# Patient Record
Sex: Female | Born: 1950 | Race: White | Hispanic: No | Marital: Married
Health system: Midwestern US, Academic
[De-identification: ages and names within clinical notes are randomized; demographics above are authoritative.]

---

## 2018-05-24 ENCOUNTER — Encounter: Admit: 2018-05-24 | Discharge: 2018-05-25 | Payer: MEDICARE

## 2018-06-08 ENCOUNTER — Encounter: Admit: 2018-06-08 | Discharge: 2018-06-09 | Payer: MEDICARE

## 2018-06-10 ENCOUNTER — Encounter: Admit: 2018-06-10 | Discharge: 2018-06-10 | Payer: MEDICARE

## 2018-06-12 ENCOUNTER — Encounter: Admit: 2018-06-12 | Discharge: 2018-06-13 | Payer: MEDICARE

## 2018-06-13 ENCOUNTER — Encounter: Admit: 2018-06-13 | Discharge: 2018-06-14 | Payer: MEDICARE

## 2018-06-14 ENCOUNTER — Encounter: Admit: 2018-06-14 | Discharge: 2018-06-14 | Payer: MEDICARE

## 2018-06-14 ENCOUNTER — Ambulatory Visit: Admit: 2018-06-14 | Discharge: 2018-06-15 | Payer: MEDICARE

## 2018-06-14 DIAGNOSIS — R197 Diarrhea, unspecified: ICD-10-CM

## 2018-06-14 DIAGNOSIS — Z820 Family history of epilepsy and other diseases of the nervous system: Principal | ICD-10-CM

## 2018-06-14 DIAGNOSIS — F419 Anxiety disorder, unspecified: ICD-10-CM

## 2018-06-14 DIAGNOSIS — IMO0001 Reflux: ICD-10-CM

## 2018-06-14 DIAGNOSIS — S0300XA Dislocation of jaw, unspecified side, initial encounter: ICD-10-CM

## 2018-06-14 DIAGNOSIS — E7800 Pure hypercholesterolemia, unspecified: ICD-10-CM

## 2018-06-14 NOTE — Progress Notes
???   predniSONE (DELTASONE) 10 mg tablet Take 10 mg by mouth three times daily.   ??? simvastatin (ZOCOR) 20 mg tablet Take 20 mg by mouth at bedtime daily.       There were no vitals filed for this visit.  There is no height or weight on file to calculate BMI.    Oswestry Total Score:: 50    Physical Exam  Ortho Exam  Alert and appropriate conversation  CN 2-12 appear grossly intact        Assessment and Plan:  68 y.o. 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     MRI L spine - Grade 2 L4/5 anterolisthesis with significant stenosis at this level.  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     We have reviewed the patients MRI with her today showing her the findings.  We would recommend she undergo and L4/5 decompression, interbody fusion and posterior fusion as well as a MIS discectomy at L5/S1.   We would like to obtain a Lumbar xray with flexion and extension prior to her surgery to make sure she doesn't have further instability at the L5/S1 level.  If she does she will need to have this level fused as well.   She is agreeable to surgical intervention and would like to get this done as soon as possible.  We told her we will get her in as soon as possible.               I personally supervised the resident performing the E/M, I examined the patient, discussed case with resident and patient, and concur with resident documentation of history, physical assessment and treatment plan unless otherwise noted.

## 2018-06-15 DIAGNOSIS — M48062 Spinal stenosis, lumbar region with neurogenic claudication: ICD-10-CM

## 2018-06-15 DIAGNOSIS — M5116 Intervertebral disc disorders with radiculopathy, lumbar region: Secondary | ICD-10-CM

## 2018-06-15 DIAGNOSIS — M4316 Spondylolisthesis, lumbar region: Principal | ICD-10-CM

## 2018-06-20 ENCOUNTER — Encounter: Admit: 2018-06-20 | Discharge: 2018-06-20 | Payer: MEDICARE

## 2018-06-20 ENCOUNTER — Encounter: Admit: 2018-06-20 | Discharge: 2018-06-21 | Payer: MEDICARE

## 2018-06-20 DIAGNOSIS — Z1159 Encounter for screening for other viral diseases: ICD-10-CM

## 2018-06-20 DIAGNOSIS — M5116 Intervertebral disc disorders with radiculopathy, lumbar region: ICD-10-CM

## 2018-06-20 DIAGNOSIS — M48062 Spinal stenosis, lumbar region with neurogenic claudication: ICD-10-CM

## 2018-06-20 DIAGNOSIS — M4316 Spondylolisthesis, lumbar region: Principal | ICD-10-CM

## 2018-06-21 ENCOUNTER — Encounter: Admit: 2018-06-21 | Discharge: 2018-06-21 | Payer: MEDICARE

## 2018-06-21 DIAGNOSIS — E7800 Pure hypercholesterolemia, unspecified: ICD-10-CM

## 2018-06-21 DIAGNOSIS — M48062 Spinal stenosis, lumbar region with neurogenic claudication: Secondary | ICD-10-CM

## 2018-06-21 DIAGNOSIS — M4316 Spondylolisthesis, lumbar region: Principal | ICD-10-CM

## 2018-06-21 DIAGNOSIS — Z820 Family history of epilepsy and other diseases of the nervous system: Principal | ICD-10-CM

## 2018-06-21 DIAGNOSIS — G4730 Sleep apnea, unspecified: ICD-10-CM

## 2018-06-21 DIAGNOSIS — IMO0001 Reflux: ICD-10-CM

## 2018-06-21 DIAGNOSIS — S0300XA Dislocation of jaw, unspecified side, initial encounter: ICD-10-CM

## 2018-06-21 DIAGNOSIS — R197 Diarrhea, unspecified: ICD-10-CM

## 2018-06-21 DIAGNOSIS — F419 Anxiety disorder, unspecified: ICD-10-CM

## 2018-06-21 DIAGNOSIS — M5126 Other intervertebral disc displacement, lumbar region: Secondary | ICD-10-CM

## 2018-06-21 LAB — CBC
Lab: 12 g/dL (ref 12.0–15.0)
Lab: 14 % (ref 11–15)
Lab: 188 10*3/uL — ABNORMAL LOW (ref >60)
Lab: 3.7 M/UL — ABNORMAL LOW (ref 4.0–5.0)
Lab: 33 g/dL — ABNORMAL HIGH (ref 32.0–36.0)
Lab: 37 % (ref 36–45)
Lab: 7.8 10*3/uL (ref 4.5–11.0)
Lab: 8.2 FL (ref >60)
Lab: 98 FL (ref 80–100)

## 2018-06-21 LAB — BASIC METABOLIC PANEL: Lab: 141 MMOL/L (ref 137–147)

## 2018-06-22 ENCOUNTER — Ambulatory Visit: Admit: 2018-06-21 | Discharge: 2018-06-22 | Payer: MEDICARE

## 2018-06-22 DIAGNOSIS — M48062 Spinal stenosis, lumbar region with neurogenic claudication: ICD-10-CM

## 2018-06-22 DIAGNOSIS — M5116 Intervertebral disc disorders with radiculopathy, lumbar region: ICD-10-CM

## 2018-06-22 DIAGNOSIS — M4316 Spondylolisthesis, lumbar region: ICD-10-CM

## 2018-06-22 DIAGNOSIS — Z01818 Encounter for other preprocedural examination: Principal | ICD-10-CM

## 2018-07-02 ENCOUNTER — Encounter: Admit: 2018-07-02 | Discharge: 2018-07-03 | Payer: MEDICARE

## 2018-07-02 ENCOUNTER — Encounter: Admit: 2018-07-02 | Discharge: 2018-07-02 | Payer: MEDICARE

## 2018-07-02 DIAGNOSIS — Z01818 Encounter for other preprocedural examination: ICD-10-CM

## 2018-07-02 NOTE — Progress Notes
Patient arrived to COVID clinic for COVID-19 testing 07/02/18 0946. Patient identity confirmed via photo I.D. Nasopharyngeal procedure explained to the patient.   Nasopharyngeal swab completed right  Patient education provided given and instructed patient self isolate until contacted w/ results and further instructions.   Swab collected by [facility], CMA.    Date symptoms began/reason for testing: Pre-Op    Yolanda Bonine, BSN, RN

## 2018-07-03 ENCOUNTER — Encounter: Admit: 2018-07-03 | Discharge: 2018-07-03

## 2018-07-03 DIAGNOSIS — F419 Anxiety disorder, unspecified: Secondary | ICD-10-CM

## 2018-07-03 DIAGNOSIS — R197 Diarrhea, unspecified: Secondary | ICD-10-CM

## 2018-07-03 DIAGNOSIS — Z820 Family history of epilepsy and other diseases of the nervous system: Secondary | ICD-10-CM

## 2018-07-03 DIAGNOSIS — Z1159 Encounter for screening for other viral diseases: Principal | ICD-10-CM

## 2018-07-03 DIAGNOSIS — E7800 Pure hypercholesterolemia, unspecified: Secondary | ICD-10-CM

## 2018-07-03 DIAGNOSIS — G4730 Sleep apnea, unspecified: Secondary | ICD-10-CM

## 2018-07-03 DIAGNOSIS — IMO0001 Reflux: Secondary | ICD-10-CM

## 2018-07-03 DIAGNOSIS — S0300XA Dislocation of jaw, unspecified side, initial encounter: Secondary | ICD-10-CM

## 2018-07-03 LAB — COVID-19 (SARS-COV-2) PCR

## 2018-07-03 NOTE — Progress Notes
Contacted patient via MyChart, as patient viewed result. Patient advised that COVID-19 test results are negative. Advised that patient can continue with the procedure from a COVID-19 testing perspective and should follow pre-procedure instructions. Advised that if they develop any concerning symptoms prior to the procedure to contact their procedure team, specialist, and/or PCP for assistance.      , DNP, RN, CCRN, SCRN, CNRN

## 2018-07-04 ENCOUNTER — Encounter: Admit: 2018-07-04 | Discharge: 2018-07-04

## 2018-07-04 ENCOUNTER — Encounter: Admit: 2018-07-04 | Discharge: 2018-07-06 | Payer: MEDICARE

## 2018-07-04 DIAGNOSIS — S0300XA Dislocation of jaw, unspecified side, initial encounter: Secondary | ICD-10-CM

## 2018-07-04 DIAGNOSIS — Z820 Family history of epilepsy and other diseases of the nervous system: Secondary | ICD-10-CM

## 2018-07-04 DIAGNOSIS — F419 Anxiety disorder, unspecified: Secondary | ICD-10-CM

## 2018-07-04 DIAGNOSIS — R197 Diarrhea, unspecified: Secondary | ICD-10-CM

## 2018-07-04 DIAGNOSIS — IMO0001 Reflux: Secondary | ICD-10-CM

## 2018-07-04 DIAGNOSIS — G4730 Sleep apnea, unspecified: Secondary | ICD-10-CM

## 2018-07-04 DIAGNOSIS — E7800 Pure hypercholesterolemia, unspecified: Secondary | ICD-10-CM

## 2018-07-04 MED ORDER — CLONAZEPAM 1 MG PO TAB
1 mg | Freq: Every evening | ORAL | 0 refills | Status: DC
Start: 2018-07-04 — End: 2018-07-06
  Administered 2018-07-05 – 2018-07-06 (×2): 1 mg via ORAL

## 2018-07-04 MED ORDER — MIDAZOLAM 1 MG/ML IJ SOLN
0 refills | Status: DC
Start: 2018-07-04 — End: 2018-07-05
  Administered 2018-07-04: 21:00:00 2 mg via INTRAVENOUS

## 2018-07-04 MED ORDER — CEFAZOLIN INJ 1GM IVP
1 g | INTRAVENOUS | 0 refills | Status: CP
Start: 2018-07-04 — End: ?
  Administered 2018-07-05 (×3): 1 g via INTRAVENOUS

## 2018-07-04 MED ORDER — METHOCARBAMOL 500 MG PO TAB
500 mg | ORAL | 0 refills | Status: DC | PRN
Start: 2018-07-04 — End: 2018-07-05

## 2018-07-04 MED ORDER — PANTOPRAZOLE 40 MG PO TBEC
40 mg | Freq: Every day | ORAL | 0 refills | Status: DC
Start: 2018-07-04 — End: 2018-07-06
  Administered 2018-07-05 – 2018-07-06 (×2): 40 mg via ORAL

## 2018-07-04 MED ORDER — DEXAMETHASONE SODIUM PHOSPHATE 4 MG/ML IJ SOLN
INTRAVENOUS | 0 refills | Status: DC
Start: 2018-07-04 — End: 2018-07-05
  Administered 2018-07-04: 21:00:00 4 mg via INTRAVENOUS

## 2018-07-04 MED ORDER — ONDANSETRON HCL (PF) 4 MG/2 ML IJ SOLN
INTRAVENOUS | 0 refills | Status: DC
Start: 2018-07-04 — End: 2018-07-05
  Administered 2018-07-05: 4 mg via INTRAVENOUS

## 2018-07-04 MED ORDER — SENNOSIDES-DOCUSATE SODIUM 8.6-50 MG PO TAB
1 | Freq: Two times a day (BID) | ORAL | 0 refills | Status: DC
Start: 2018-07-04 — End: 2018-07-06
  Administered 2018-07-05 – 2018-07-06 (×4): 1 via ORAL

## 2018-07-04 MED ORDER — ROCURONIUM 10 MG/ML IV SOLN
INTRAVENOUS | 0 refills | Status: DC
Start: 2018-07-04 — End: 2018-07-05
  Administered 2018-07-04: 21:00:00 70 mg via INTRAVENOUS
  Administered 2018-07-04: 23:00:00 10 mg via INTRAVENOUS

## 2018-07-04 MED ORDER — ACETAMINOPHEN 1,000 MG/100 ML (10 MG/ML) IV SOLN
0 refills | Status: DC
Start: 2018-07-04 — End: 2018-07-05
  Administered 2018-07-04: 1000 mg via INTRAVENOUS

## 2018-07-04 MED ORDER — LOSARTAN 50 MG PO TAB
50 mg | Freq: Every day | ORAL | 0 refills | Status: DC
Start: 2018-07-04 — End: 2018-07-06
  Administered 2018-07-05 – 2018-07-06 (×2): 50 mg via ORAL

## 2018-07-04 MED ORDER — BUPIVACAINE-EPINEPHRINE 0.25 %-1:200,000 IJ SOLN
0 refills | Status: DC
Start: 2018-07-04 — End: 2018-07-05
  Administered 2018-07-04: 22:00:00 6 mL via SUBCUTANEOUS

## 2018-07-04 MED ORDER — GABAPENTIN 300 MG PO CAP
300 mg | Freq: Three times a day (TID) | ORAL | 0 refills | Status: DC
Start: 2018-07-04 — End: 2018-07-06
  Administered 2018-07-05 – 2018-07-06 (×5): 300 mg via ORAL

## 2018-07-04 MED ORDER — THROMBIN (BOVINE) 5,000 UNIT TP SOLR
0 refills | Status: DC
Start: 2018-07-04 — End: 2018-07-05
  Administered 2018-07-04: 22:00:00 5000 [IU] via TOPICAL

## 2018-07-04 MED ORDER — HYDROMORPHONE (PF) 2 MG/ML IJ SYRG
.5 mg | INTRAVENOUS | 0 refills | Status: DC | PRN
Start: 2018-07-04 — End: 2018-07-05
  Administered 2018-07-05 (×4): 0.5 mg via INTRAVENOUS

## 2018-07-04 MED ORDER — CITALOPRAM 20 MG PO TAB
20 mg | Freq: Every day | ORAL | 0 refills | Status: DC
Start: 2018-07-04 — End: 2018-07-06
  Administered 2018-07-05 – 2018-07-06 (×2): 20 mg via ORAL

## 2018-07-04 MED ORDER — LIDOCAINE (PF) 10 MG/ML (1 %) IJ SOLN
.1-2 mL | INTRAMUSCULAR | 0 refills | Status: DC | PRN
Start: 2018-07-04 — End: 2018-07-06

## 2018-07-04 MED ORDER — CEFAZOLIN INJ 1GM IVP
1 g | INTRAVENOUS | 0 refills | Status: DC
Start: 2018-07-04 — End: 2018-07-05

## 2018-07-04 MED ORDER — BACITRACIN 50,000 UN NS 500 ML IRR BOT (OR)
0 refills | Status: DC
Start: 2018-07-04 — End: 2018-07-05
  Administered 2018-07-04 (×2): 500 mL

## 2018-07-04 MED ORDER — HYDROMORPHONE (PF) 2 MG/ML IJ SYRG
0 refills | Status: DC
Start: 2018-07-04 — End: 2018-07-05
  Administered 2018-07-04 (×2): .5 mg via INTRAVENOUS
  Administered 2018-07-04: 0.5 mg via INTRAVENOUS

## 2018-07-04 MED ORDER — ONDANSETRON HCL (PF) 4 MG/2 ML IJ SOLN
4 mg | INTRAVENOUS | 0 refills | Status: DC | PRN
Start: 2018-07-04 — End: 2018-07-06

## 2018-07-04 MED ORDER — SIMVASTATIN 20 MG PO TAB
20 mg | Freq: Every evening | ORAL | 0 refills | Status: DC
Start: 2018-07-04 — End: 2018-07-06
  Administered 2018-07-05 – 2018-07-06 (×2): 20 mg via ORAL

## 2018-07-04 MED ORDER — SUGAMMADEX 100 MG/ML IV SOLN
INTRAVENOUS | 0 refills | Status: DC
Start: 2018-07-04 — End: 2018-07-05
  Administered 2018-07-05: 150 mg via INTRAVENOUS

## 2018-07-04 MED ORDER — LIDOCAINE (PF) 200 MG/10 ML (2 %) IJ SYRG
0 refills | Status: DC
Start: 2018-07-04 — End: 2018-07-05
  Administered 2018-07-04: 21:00:00 100 mg via INTRAVENOUS

## 2018-07-04 MED ORDER — BISACODYL 10 MG RE SUPP
10 mg | Freq: Every day | RECTAL | 0 refills | Status: DC
Start: 2018-07-04 — End: 2018-07-06

## 2018-07-04 MED ORDER — DEXTRAN 70-HYPROMELLOSE (PF) 0.1-0.3 % OP DPET
0 refills | Status: DC
Start: 2018-07-04 — End: 2018-07-05
  Administered 2018-07-04: 21:00:00 2 [drp] via OPHTHALMIC

## 2018-07-04 MED ORDER — SODIUM CHLORIDE 0.9 % IV SOLP
1000 mL | INTRAVENOUS | 0 refills | Status: DC
Start: 2018-07-04 — End: 2018-07-05
  Administered 2018-07-04: 18:00:00 1000 mL via INTRAVENOUS
  Administered 2018-07-04: 23:00:00 1000.000 mL via INTRAVENOUS

## 2018-07-04 MED ORDER — ETOMIDATE 2 MG/ML IV SOLN
0 refills | Status: DC
Start: 2018-07-04 — End: 2018-07-05
  Administered 2018-07-04: 21:00:00 16 mg via INTRAVENOUS

## 2018-07-04 MED ORDER — FENTANYL CITRATE (PF) 50 MCG/ML IJ SOLN
25-50 ug | INTRAVENOUS | 0 refills | Status: DC | PRN
Start: 2018-07-04 — End: 2018-07-05

## 2018-07-04 MED ORDER — EPHEDRINE SULFATE 50 MG/ML IJ SOLN
0 refills | Status: DC
Start: 2018-07-04 — End: 2018-07-05
  Administered 2018-07-04: 22:00:00 10 mg via INTRAVENOUS

## 2018-07-04 MED ORDER — CEFAZOLIN 1 GRAM IJ SOLR
0 refills | Status: DC
Start: 2018-07-04 — End: 2018-07-05
  Administered 2018-07-04: 21:00:00 2 g via INTRAVENOUS

## 2018-07-04 MED ORDER — DOCUSATE SODIUM 100 MG PO CAP
100 mg | Freq: Two times a day (BID) | ORAL | 0 refills | Status: DC
Start: 2018-07-04 — End: 2018-07-06
  Administered 2018-07-05 – 2018-07-06 (×4): 100 mg via ORAL

## 2018-07-04 MED ORDER — OXYCODONE 5 MG PO TAB
5-10 mg | ORAL | 0 refills | Status: DC | PRN
Start: 2018-07-04 — End: 2018-07-06
  Administered 2018-07-05 (×3): 10 mg via ORAL
  Administered 2018-07-05: 04:00:00 5 mg via ORAL
  Administered 2018-07-06 (×4): 10 mg via ORAL

## 2018-07-04 MED ORDER — ACETAMINOPHEN 325 MG PO TAB
650 mg | ORAL | 0 refills | Status: DC | PRN
Start: 2018-07-04 — End: 2018-07-06
  Administered 2018-07-05 – 2018-07-06 (×7): 650 mg via ORAL

## 2018-07-04 MED ORDER — MAGNESIUM HYDROXIDE 2,400 MG/10 ML PO SUSP
10 mL | Freq: Every day | ORAL | 0 refills | Status: DC
Start: 2018-07-04 — End: 2018-07-06
  Administered 2018-07-05 – 2018-07-06 (×2): 10 mL via ORAL

## 2018-07-05 MED ORDER — HEPARIN, PORCINE (PF) 5,000 UNIT/0.5 ML IJ SYRG
5000 [IU] | SUBCUTANEOUS | 0 refills | Status: DC
Start: 2018-07-05 — End: 2018-07-06
  Administered 2018-07-06 (×2): 5000 [IU] via SUBCUTANEOUS

## 2018-07-05 MED ORDER — METHOCARBAMOL 750 MG PO TAB
750 mg | ORAL | 0 refills | Status: DC
Start: 2018-07-05 — End: 2018-07-05

## 2018-07-05 MED ORDER — DIAZEPAM 5 MG PO TAB
2.5 mg | ORAL | 0 refills | Status: DC | PRN
Start: 2018-07-05 — End: 2018-07-06
  Administered 2018-07-06: 06:00:00 2.5 mg via ORAL

## 2018-07-05 MED ORDER — PATCH DOCUMENTATION - LIDOCAINE 5%
Freq: Two times a day (BID) | TRANSDERMAL | 0 refills | Status: DC
Start: 2018-07-05 — End: 2018-07-06

## 2018-07-05 MED ORDER — METHOCARBAMOL 500 MG PO TAB
500 mg | ORAL | 0 refills | Status: DC
Start: 2018-07-05 — End: 2018-07-05
  Administered 2018-07-05: 14:00:00 500 mg via ORAL

## 2018-07-05 MED ORDER — METHOCARBAMOL 750 MG PO TAB
750 mg | ORAL | 0 refills | Status: DC
Start: 2018-07-05 — End: 2018-07-06
  Administered 2018-07-06 (×3): 750 mg via ORAL

## 2018-07-05 MED ORDER — METHOCARBAMOL 750 MG PO TAB
750 mg | ORAL | 0 refills | Status: DC
Start: 2018-07-05 — End: 2018-07-05
  Administered 2018-07-05: 19:00:00 750 mg via ORAL

## 2018-07-05 MED ORDER — LIDOCAINE 5 % TP PTMD
1 | Freq: Every day | TOPICAL | 0 refills | Status: DC
Start: 2018-07-05 — End: 2018-07-06
  Administered 2018-07-06 (×2): 1 via TOPICAL

## 2018-07-05 NOTE — Other
SPACER SPINAL 22X8X8-14MM RISE 10D - SNA  SPACER SPINAL 22X8X8-14MM RISE 10D NA [employer] NA  1 Implanted   ROD SPINAL 5.5MM CREO MIS CURVE TITANIUM - SNA  ROD SPINAL 5.5MM CREO MIS CURVE TITANIUM NA [employer] NA  1 Implanted         Drains: None    Disposition:  PACU - stable    Luis Abed, MD  Pager [phone]

## 2018-07-06 ENCOUNTER — Encounter: Admit: 2018-07-04 | Discharge: 2018-07-04

## 2018-07-06 ENCOUNTER — Encounter: Admit: 2018-07-06 | Discharge: 2018-07-06

## 2018-07-06 DIAGNOSIS — F17210 Nicotine dependence, cigarettes, uncomplicated: ICD-10-CM

## 2018-07-06 DIAGNOSIS — M5116 Intervertebral disc disorders with radiculopathy, lumbar region: ICD-10-CM

## 2018-07-06 DIAGNOSIS — G4730 Sleep apnea, unspecified: Secondary | ICD-10-CM

## 2018-07-06 DIAGNOSIS — M4316 Spondylolisthesis, lumbar region: Principal | ICD-10-CM

## 2018-07-06 DIAGNOSIS — Z7982 Long term (current) use of aspirin: Secondary | ICD-10-CM

## 2018-07-06 DIAGNOSIS — F419 Anxiety disorder, unspecified: Secondary | ICD-10-CM

## 2018-07-06 DIAGNOSIS — Z820 Family history of epilepsy and other diseases of the nervous system: Secondary | ICD-10-CM

## 2018-07-06 DIAGNOSIS — M48062 Spinal stenosis, lumbar region with neurogenic claudication: Secondary | ICD-10-CM

## 2018-07-06 DIAGNOSIS — Z01818 Encounter for other preprocedural examination: ICD-10-CM

## 2018-07-06 DIAGNOSIS — M5126 Other intervertebral disc displacement, lumbar region: Secondary | ICD-10-CM

## 2018-07-06 DIAGNOSIS — S0300XA Dislocation of jaw, unspecified side, initial encounter: Secondary | ICD-10-CM

## 2018-07-06 DIAGNOSIS — E7800 Pure hypercholesterolemia, unspecified: Secondary | ICD-10-CM

## 2018-07-06 DIAGNOSIS — IMO0001 Reflux: Secondary | ICD-10-CM

## 2018-07-06 DIAGNOSIS — R197 Diarrhea, unspecified: Secondary | ICD-10-CM

## 2018-07-06 MED ORDER — OXYCODONE 5 MG PO TAB
5-10 mg | ORAL_TABLET | ORAL | 0 refills | 6.00000 days | Status: DC | PRN
Start: 2018-07-06 — End: 2018-07-10

## 2018-07-06 MED ORDER — ASPIRIN 81 MG PO TBEC
81 mg | ORAL_TABLET | Freq: Every evening | ORAL | 0 refills | Status: AC
Start: 2018-07-06 — End: ?

## 2018-07-06 MED ORDER — ACETAMINOPHEN 325 MG PO TAB
650 mg | ORAL | 0 refills | Status: AC | PRN
Start: 2018-07-06 — End: ?

## 2018-07-06 MED ORDER — SENNOSIDES-DOCUSATE SODIUM 8.6-50 MG PO TAB
1 | Freq: Two times a day (BID) | ORAL | 0 refills | Status: AC | PRN
Start: 2018-07-06 — End: ?

## 2018-07-06 MED ORDER — METHOCARBAMOL 750 MG PO TAB
750 mg | ORAL_TABLET | ORAL | 0 refills | Status: AC | PRN
Start: 2018-07-06 — End: ?

## 2018-07-06 NOTE — Care Plan
Discussed AVS including: wound care, activity restrictions, medication instructions, goals of care at home, follow up appointment and clinic contact.

## 2018-07-06 NOTE — Care Coordination-Inpatient
Melanie Tucker, Melanie Tucker    Minimally Invasive Lumbar 5-Sacral 1 Microdiscectomy on 07/04/2018 with Dr. Earlene Plater    Neurosurgery Discharge Instructions    Contact information:  ? Call Neurosurgery if you have questions or are experiencing problems at discharge [phone].  Post-operative wound care:  ? Your incisions have glue in place. Your incision may be open to air.  ? Keep your incision dry for 3 days. Cover incision while showering until 07/07/2018.  Starting 07/08/2018 use baby shampoo to wash incision daily, pat dry and leave open to air.  ? Do not submerge (pool/tub) your incision under water at all for 4 weeks.  ? Have someone look at your incision every day. It should look the same or better daily.  ? Do not apply any ointment, cream or lotions to incision line.   Activity restrictions:  ? Avoid pushing, pulling, lifting or bending more than 10 pounds (about a gallon of milk). If you hold children, they should be placed in your lap or crawl into lap if old enough.  ? Do NOT drive until you are cleared by your physician.   Post-operative pain and medications:  ? Please use your pain medications and muscle relaxers as prescribed.  ? Pain medications can make you constipated. You may take a stool softener and miralax.  ? Tylenol and Ibuprofen are approved for pain control.  This is available over the counter.   Follow up appointment:   ? 07/18/2018 @ 1:00 with Nurse Practitioner for wound check and follow up.  ? 08/16/2018 @ 1:30 with Dr. Earlene Plater Neurosurgeon for surgical follow up.  Please contact Neurosurgery if you develop any of the following:  ? New or worsening numbness, tingling, or decrease sensation in arms or legs.  ? New or worsening changes in mobility or gait (walking).  ? Fever 101 or greater.  Redness, swelling, continuous oozing, fluid collection, warmth or bad odor near the incision site.  ? Intense pain that is getting worse or unrelieved by pain medications or muscle relaxers.          Kem Kays, RN Clinical Nurse Coordinator  Neurosurgery  Cell: [phone]  Office: [phone]

## 2018-07-06 NOTE — Progress Notes
Gait: Assistance Level: Minimal assist  Gait: Assistive Device: Roller walker    Activity Tolerance  Endurance: 3/5 Tolerates 25-30 Minutes Exercise w/Multiple Rests    Cognition  Overall Cognitive Status: WFL to Adequately Complete Self Care Tasks Safely  Attention: Awake/Alert  Cognition Comment: Slight intermittent confusion today, requiring reminders and repetition of instruction.     Assessment  Assessment: Decreased ADL Status;Decreased Endurance;Decreased High-Level ADLs;Decreased Self-Care Trans  Prognosis: Good;w/Cont OT s/p Acute Discharge  Goal Formulation: Patient    Plan  Progress: Progressing Toward Goals  OT Frequency: 5x/week  OT Plan for Next Visit: progress bed mobility and functional transfers with LRD    ADL Goals  Patient Will Perform LE Dressing: w/ Modified Independent  Patient Will Perform Toileting: Independently  ???  Functional Transfer Goals  Pt Will Perform All Functional Transfers: Modified Independent(w/ least restrictive assistive device )    OT Discharge Recommendations  Recommendation: Home with intermittent supervision/assistance  Recommendation for Therapy Post Discharge: Home health  Patient Currently Requires Physical Assist With: All home functioning ADLs;All mobility  Patient Currently Requires Equipment: Walker with wheels  Patient requires the use of a walker with wheels to complete ADL???s in the home including meal preparation, ambulation the bathroom for toileting, bathing and grooming, and safe home mobility.  Patient is unable to complete these ADL???s with a cane or crutch and can safely use the walker.     Adaptive equipment provided by OT 6/4.     Therapist: Darlin Priestly, OTR/L [phone]  Date: 07/06/2018

## 2018-07-10 ENCOUNTER — Encounter: Admit: 2018-07-10 | Discharge: 2018-07-10

## 2018-07-10 MED ORDER — OXYCODONE 5 MG PO TAB
10 mg | ORAL_TABLET | ORAL | 0 refills | 6.00000 days | Status: DC | PRN
Start: 2018-07-10 — End: 2018-08-16

## 2018-07-10 NOTE — Telephone Encounter
[facility] calling stating that the Oxy prescription patient was given, they only dispensed #42 out of #60 was written for. Patient's spouse is calling pharmacy today stating she is almost out and they need a refill. This was dispensed to her four days ago. Patient's spouse says pain has been so bad that she has been taking 2 every four hours. Will route to Dr Rosana Hoes and covering RN to see what would like to do.

## 2018-07-12 ENCOUNTER — Encounter: Admit: 2018-07-12 | Discharge: 2018-07-12

## 2018-07-12 NOTE — Telephone Encounter
Spoke with Leda Gauze tonight regarding recovery from procedure.  She states she is doing much better with pain in last two days.  She states her doctor called her in 10 mg Tablets of Oxycodone every 6 hours and this helped tremendously, she is also using Tylenol 1000 mg Q 8.   She endorses good mobility, sleeping better and feels she is on the upward swing.  She states incision is looking good and her friend is an Therapist, sports and states incision is healing well.  She denies any questions/concerns tonight, she is aware of all upcoming appointments and has clinic number for any needs.       Cheryl Flash, RN  Clinical Nurse Coordinator  Neurosurgery  Cell: [phone]  Office: [phone]

## 2018-07-18 ENCOUNTER — Encounter: Admit: 2018-07-18 | Discharge: 2018-07-18

## 2018-07-18 ENCOUNTER — Ambulatory Visit: Admit: 2018-07-18 | Discharge: 2018-07-19

## 2018-07-18 DIAGNOSIS — R197 Diarrhea, unspecified: Secondary | ICD-10-CM

## 2018-07-18 DIAGNOSIS — Z5189 Encounter for other specified aftercare: Secondary | ICD-10-CM

## 2018-07-18 DIAGNOSIS — E7800 Pure hypercholesterolemia, unspecified: Secondary | ICD-10-CM

## 2018-07-18 DIAGNOSIS — G4730 Sleep apnea, unspecified: Secondary | ICD-10-CM

## 2018-07-18 DIAGNOSIS — S0300XA Dislocation of jaw, unspecified side, initial encounter: Secondary | ICD-10-CM

## 2018-07-18 DIAGNOSIS — F419 Anxiety disorder, unspecified: Secondary | ICD-10-CM

## 2018-07-18 DIAGNOSIS — IMO0001 Reflux: Secondary | ICD-10-CM

## 2018-07-18 DIAGNOSIS — Z820 Family history of epilepsy and other diseases of the nervous system: Secondary | ICD-10-CM

## 2018-07-18 NOTE — Progress Notes
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 Hegler returns today for 2 wk f/u s/p MINIMALLY INVASIVE LUMBAR 5-SACRAL 1 MICRODISCECTOMY  POSTERIOR LUMBAR DECOMPRESSION AND INTERBODY FUSION, LUMBAR 4-5 on 07/04/2018 by Dr. Rosana Hoes.  Report back and leg pain much improved.  Having moderate incisional pain.  Eating/drinking well.  Denies constipation.  Incision edges well approximated.   No S/S of infection noted.     Physical Exam     Alert/Fully Oriented  Neuro Intact and Stable  Incision edges well approximated.  No S/S of infection noted.   Gait Steady     Ms. Leclaire returns today for 2 wk f/u.  Overall, she is doing well.  Incision is healing well.  No S/S of infection noted.   Discussed wound care.  All of her questions were answered.  She will return for surgical follow up on 08/02/2018 with Dr. Rosana Hoes.         Please call [phone] with questions or concern    Holland Commons, APRN-NP             Surgeon: Dr Rosana Hoes  Procedure: MINIMALLY INVASIVE LUMBAR 5-SACRAL 1 MICRODISCECTOMY POSTERIOR LUMBAR DECOMPRESSION AND INTERBODY FUSION on 07/04/18  Dressing: incision is C,D,I with no s/s of infection. Skin was closed with monocryl and dermabond  Imaging: n/a  Home health/Rehab: n/a  Support: spouse  POD: 13

## 2018-08-01 NOTE — Patient Instructions
N/A

## 2018-08-09 ENCOUNTER — Encounter: Admit: 2018-08-09 | Discharge: 2018-08-09

## 2018-08-16 ENCOUNTER — Ambulatory Visit: Admit: 2018-08-16 | Discharge: 2018-08-16

## 2018-08-16 ENCOUNTER — Encounter: Admit: 2018-08-16 | Discharge: 2018-08-16

## 2018-08-16 DIAGNOSIS — M48062 Spinal stenosis, lumbar region with neurogenic claudication: Secondary | ICD-10-CM

## 2018-08-16 DIAGNOSIS — M5116 Intervertebral disc disorders with radiculopathy, lumbar region: Secondary | ICD-10-CM

## 2018-08-16 DIAGNOSIS — E7800 Pure hypercholesterolemia, unspecified: Secondary | ICD-10-CM

## 2018-08-16 DIAGNOSIS — IMO0001 Reflux: Secondary | ICD-10-CM

## 2018-08-16 DIAGNOSIS — F419 Anxiety disorder, unspecified: Secondary | ICD-10-CM

## 2018-08-16 DIAGNOSIS — M4316 Spondylolisthesis, lumbar region: Principal | ICD-10-CM

## 2018-08-16 DIAGNOSIS — S0300XA Dislocation of jaw, unspecified side, initial encounter: Secondary | ICD-10-CM

## 2018-08-16 DIAGNOSIS — R197 Diarrhea, unspecified: Secondary | ICD-10-CM

## 2018-08-16 DIAGNOSIS — G4730 Sleep apnea, unspecified: Secondary | ICD-10-CM

## 2018-08-16 DIAGNOSIS — Z820 Family history of epilepsy and other diseases of the nervous system: Secondary | ICD-10-CM

## 2018-08-16 NOTE — Progress Notes
Date of Service: 08/16/2018     Subjective:          She presents for a 6-week follow-up after a discectomy and fusion of the lumbar spine.    History of Present Illness    Melanie Tucker is a 68 y.o. female who presents 6 weeks postop from a L4-5 decompression fusion and L5-S1 discectomy.  She has been doing very well at home.  She can walk about three fourths of a mile until she gets the right foot numbness that prevents her from walking any further.  She is otherwise been quite active.  She has been maintaining some degree of restrictions including lifting as well as bending.  Wishes to see if those restrictions can be lifted.    He is overall very happy with the results of the surgery.  However she continues to have some right foot numbness that has been bothersome for her since surgery.     Review of Systems   Constitutional: Negative.    HENT: Negative.    Eyes: Negative.    Respiratory: Negative.    Cardiovascular: Negative.    Gastrointestinal: Negative.    Endocrine: Negative.    Genitourinary: Negative.    Musculoskeletal: Negative.    Skin: Negative.    Allergic/Immunologic: Negative.    Neurological: Positive for numbness.   Hematological: Negative.    Psychiatric/Behavioral: Negative.          Objective:         ??? acetaminophen (TYLENOL) 325 mg tablet Take two tablets by mouth every 4 hours as needed. Indications: pain   ??? aspirin EC 81 mg tablet Take one tablet by mouth at bedtime daily. Take with food. HOLD for 1 week post surgery, may RESUME on 07/11/2018   ??? calcium carbonate/vitamin D-3 (OSCAL-500+D) 1250 mg/200 unit tablet Take 1 tablet by mouth daily. Calcium Carb 1250mg  delivers 500mg  elemental Ca   ??? citalopram (CELEXA) 20 mg tablet Take 20 mg by mouth daily.     ??? clonazePAM (KLONOPIN) 1 mg tablet Take 1 mg by mouth at bedtime daily.   ??? fish oil- omega 3-DHA/EPA 300/1,000 mg capsule Take 1 capsule by mouth at bedtime daily. ??? gabapentin (NEURONTIN) 300 mg capsule Take 300 mg by mouth three times daily.   ??? losartan (COZAAR) 50 mg tablet Take 50 mg by mouth daily.     ??? methocarbamoL (ROBAXIN) 750 mg tablet Take one tablet by mouth every 6 hours as needed for Spasms. Indications: muscle spasm   ??? pantoprazole DR (PROTONIX) 40 mg tablet Take 40 mg by mouth daily.   ??? senna/docusate (SENOKOT-S) 8.6/50 mg tablet Take one tablet by mouth twice daily as needed.   ??? simvastatin (ZOCOR) 20 mg tablet Take 20 mg by mouth at bedtime daily.     ??? Vit  A,C,E-Zinc-Copper (ICAPS AREDS) 14,320-226-200 unit-mg-unit cap Take 1 capsule by mouth daily.   ??? vitamins, multiple tablet Take 1 tablet by mouth daily.     Vitals:    08/16/18 1331   Weight: 72.1 kg (159 lb)   Height: 160 cm (63)   PainSc: Zero     Body mass index is 28.17 kg/m???.       Physical Exam  Constitutional:       Appearance: Normal appearance.   HENT:      Head: Normocephalic and atraumatic.   Eyes:      Extraocular Movements: Extraocular movements intact.   Neck:      Musculoskeletal: Normal  range of motion.   Pulmonary:      Effort: Pulmonary effort is normal.   Abdominal:      Palpations: Abdomen is soft.   Skin:     General: Skin is warm.   Neurological:      General: No focal deficit present.      Mental Status: She is alert.   Psychiatric:         Mood and Affect: Mood normal.       Exam she is awake and alert with fluent speech.  Her cranial nerves are grossly intact.  She has 5 out of 5 strength in the upper and lower extremities.  She has a normal gait.  Her sensations are grossly intact except on the right foot where there is some decrease sensation primarily in the anterior half of her foot.  No cerebellar dysfunction.    Radiology: Flexion-extension imaging of her lumbar spine.  There is no abnormal motion.  Hardware appears intact.    Assessment and Plan:  [name] is a 68 year old who is 6 weeks postop from an L4-5 interbody fusion and an L5-S1 discectomy.  She has recovered well which is reassuring.  She continues to have some right foot numbness that should get better with time.    She does not have any major restrictions postsurgically.  We will have her follow-up in about 2 months with another set of plain film images of her lumbar spine.    I personally supervised the resident performing the E/M, I examined the patient, discussed case with resident and patient, and concur with resident documentation of history, physical assessment and treatment plan unless otherwise noted.

## 2018-08-16 NOTE — Patient Instructions
Thank you for participating in your Clinic/Tele Health visit today with Dr. Justin Davis.      Clinical Nurse Coordinator for Dr Davis-Department of Neurosurgery    RN, BSN [phone].  Please call or send a my chart message with any nursing questions or concerns.  Fax [phone]  Appointment scheduling [phone]

## 2018-10-12 ENCOUNTER — Encounter: Admit: 2018-10-12 | Discharge: 2018-10-12

## 2018-10-12 DIAGNOSIS — M5116 Intervertebral disc disorders with radiculopathy, lumbar region: Secondary | ICD-10-CM

## 2018-10-12 DIAGNOSIS — M4316 Spondylolisthesis, lumbar region: Secondary | ICD-10-CM

## 2018-10-18 ENCOUNTER — Ambulatory Visit: Admit: 2018-10-18 | Discharge: 2018-10-18 | Payer: MEDICARE

## 2018-10-18 ENCOUNTER — Encounter: Admit: 2018-10-18 | Discharge: 2018-10-18 | Payer: MEDICARE

## 2018-10-18 NOTE — Progress Notes
Date of Service: 10/18/2018     Subjective:               History of Present Illness    Melanie Tucker is a 68 y.o. female.  She is here for follow-up of her L4-5 minimally invasive fusion and L5/S1 microdiscectomy on 07/04/2018.  She is doing quite well and denies any significant pain.  She is back to her normal level of activity.  I independently reviewed lumbar films from clinic today which demonstrate good, stable placement of the hardware without abnormal motion on flexion/extension views.       Review of Systems   Constitutional: Negative.    HENT: Negative.    Eyes: Negative.    Respiratory: Negative.    Cardiovascular: Negative.    Gastrointestinal: Negative.    Endocrine: Negative.    Genitourinary: Negative.    Musculoskeletal: Negative.    Skin: Negative.    Allergic/Immunologic: Negative.    Neurological: Negative.    Hematological: Negative.    Psychiatric/Behavioral: Negative.          Objective:         ? acetaminophen (TYLENOL) 325 mg tablet Take two tablets by mouth every 4 hours as needed. Indications: pain   ? aspirin EC 81 mg tablet Take one tablet by mouth at bedtime daily. Take with food. HOLD for 1 week post surgery, may RESUME on 07/11/2018   ? calcium carbonate/vitamin D-3 (OSCAL-500+D) 1250 mg/200 unit tablet Take 1 tablet by mouth daily. Calcium Carb 1250mg  delivers 500mg  elemental Ca   ? citalopram (CELEXA) 20 mg tablet Take 20 mg by mouth daily.     ? clonazePAM (KLONOPIN) 1 mg tablet Take 1 mg by mouth at bedtime daily.   ? fish oil- omega 3-DHA/EPA 300/1,000 mg capsule Take 1 capsule by mouth at bedtime daily.   ? gabapentin (NEURONTIN) 300 mg capsule Take 300 mg by mouth three times daily.   ? losartan (COZAAR) 50 mg tablet Take 50 mg by mouth daily.     ? methocarbamoL (ROBAXIN) 750 mg tablet Take one tablet by mouth every 6 hours as needed for Spasms. Indications: muscle spasm   ? pantoprazole DR (PROTONIX) 40 mg tablet Take 40 mg by mouth daily. ? senna/docusate (SENOKOT-S) 8.6/50 mg tablet Take one tablet by mouth twice daily as needed.   ? simvastatin (ZOCOR) 20 mg tablet Take 20 mg by mouth at bedtime daily.     ? Vit  A,C,E-Zinc-Copper (ICAPS AREDS) 14,320-226-200 unit-mg-unit cap Take 1 capsule by mouth daily.   ? vitamins, multiple tablet Take 1 tablet by mouth daily.     Vitals:    10/18/18 1346   BP: (!) 142/77   Pulse: 73   Weight: 72.1 kg (159 lb)   Height: 160 cm (63)   PainSc: Zero     Body mass index is 28.17 kg/m?Marland Kitchen       Physical Exam  Awake, alert, and oriented x4  Grossly neurologically intact       Assessment and Plan:  Patient is healed well from her surgical intervention.  I will see her back on an as-needed basis.  Oswestry Total Score:: 4

## 2019-04-21 ENCOUNTER — Encounter: Admit: 2019-04-21 | Discharge: 2019-04-21 | Payer: MEDICARE

## 2019-04-21 NOTE — Patient Instructions
Coronavirus Disease 2019 (COVID-19): Overview  Coronavirus disease 2019 (COVID-19) is a respiratory illness. It's caused by a new (novel) coronavirus. There are many types of coronavirus. Coronaviruses are a very common cause of colds and bronchitis. They may sometimes cause lung infection (pneumonia). Symptoms can range from mild to severe. Some people have no symptoms.?These viruses are also found in some animals.   All 50 states in the [place] have reported cases of COVID-19. Most states report community spread of COVID-19. This means the source of the illness is not known.?COVID-19 is a rapidly-emerging infectious disease. This means that scientists are actively researching it.?There are information updates regularly.   Public health officials are working to find the source. How the virus spreads is not yet fully understood, but it seems to spread and infect people fairly easily. Some people who have been infected in an area may not be sure how or where they were infected. The virus may be spread through droplets of fluid that a person coughs or sneezes into the air. It may be spread if you touch a surface with the virus on it, such as a handle or object, and then touch your eyes, nose, or mouth.   For the latest information, visit the CDC website at [employer]. Or call 800-CDC-INFO 570-517-8307).     What are the symptoms of COVID-19?  Some people have no symptoms or mild symptoms. Symptoms can also vary from person to person. As experts learn more about COVID-19, other symptoms are being reported. Symptoms may appear 2 to 14 days after contact with the virus:   ? Fever or chills  ? Coughing  ? Trouble breathing or feeling short of breath  ? Sore throat  ? Stuffy or runny nose  ? Headache and body aches  ? Fatigue  ? Nausea, vomiting, diarrhea, or abdominal pain  ? New loss of sense of smell or taste  You can check your symptoms with the CDC?s Coronavirus Self-Checker.     What are possible complications from COVID-19?  In many cases, this virus can cause infection (pneumonia) in both lungs. In some cases, this can cause death. Certain people are at higher risk for complications. This includes older adults and people with serious chronic health conditions such as heart or lung disease, diabetes, or kidney disease. It includes people with health conditions that suppress the immune system. And it includes people taking medicines that suppress the immune system.   As experts learn more about COVID-19, other complications are being reported that may be linked to COVID-19. Rarely, some children have developed severe complications called multisystem inflammatory syndrome in children (MIS-C). MIS-C seems to be similar to [facility] disease, a rare condition causing inflammation of blood vessels and body organs. It's not yet known if MIS-C happens only in children, or if adults are also at risk. It's also not known if it's related to COVID-19, because many children, but not all, have tested positive for the virus. Experts continue to study MIS-C. The CDC advises healthcare providers to report to local health departments any person under age 57 years old who is ill enough to be in the hospital and has all of the following:   ? A fever over 100.4?F (38.0?C) for more than 24 hours and a positive SARS-CoV-2 test or exposure to the virus in the last 4 weeks   ? Inflammation in at least 2 organs such as the heart, lungs, or kidneys with lab tests that show inflammation   ? No  other diagnoses besides COVID-19 explain the child's symptoms     How is COVID-19 diagnosed?  Your healthcare provider will ask about your symptoms. He or she will ask where you live, and about your recent travel, and any contact with sick people. If your healthcare provider thinks you may have COVID-19, he or she will consider whether to test you for COVID-19. This depends on the availability of testing in your area, and how sick you are. Follow all instructions from your healthcare provider. Guidelines for testing may change as more information about the virus becomes available. Currently, COVID-19 is diagnosed by:   ? Viral test.  Viral tests tell if you have a current COVID-19 infection. A nose-throat swab may be wiped inside your nose to the back of your throat. Or a sample of your saliva may be taken. Either of these samples will be checked for the SARS-CoV-2 virus. Availability of tests vary. Some test kits can be done at home. But both nose-throat swabs and saliva tests must be sent to a lab to be looked at.   If your healthcare provider thinks or confirms that you have COVID-19, you may have other tests. These tests may include:   ? Antibody blood test.  Antibody tests are being looked at to find out if a person has previously been infected with the virus and may now have antibodies such as SARS AB IgG in their blood to give some immunity. The accuracy and availability of antibody tests vary. An antibody test may not be able to show if you have a current infection because it can take up to a few weeks after infection to make antibodies. It's not yet known how long immunity lasts after being infected with the virus.   ? Sputum culture.  A small sample of mucus coughed from your lungs (sputum) may be collected if you have a moist cough. It may be checked for the virus or to look for pneumonia.   ? Imaging tests.  You may have a chest X-ray or CT scan.       Note about reinfection and your immunity  At this time, it's unclear if people can be reinfected with COVID-19. The CDC notes that if a person has fully recovered from COVID-19 and is retested within 3 months of the first infection, they may continue to have low levels of the virus in their body and test positive for COVID-19, even though they are not spreading COVID-19. Having a positive COVID-19 test after an infection doesn't mean you can't be reinfected. It's not yet known how long immunity lasts after being infected with the virus.   How is COVID-19 treated?  There is currently no medicine proven to prevent or treat the virus. Some experimental medicines are being tested for COVID-19. Other medicines used to treat other conditions are being looked at for COVID-19, but these are not currently approved to treat it.   The most proven treatments right now are those to help your body while it fights the virus. This is known as supportive care. Supportive care may include:   ? Getting rest.  This helps your body fight the illness.   ? Staying hydrated.  Drinking liquids is the best way to prevent dehydration.. Try to drink 6 to 8 glasses of liquids every day, or as advised by your provider. Also check with your provider about which fluids are best for you. Don't drink fluids that contain caffeine or alcohol.   ? Taking over-the-counter (  OTC)?pain medicine.  These are used to help ease pain and reduce fever. Follow your healthcare provider's instructions for which OTC medicine to use.   For severe illness, you may need to stay in the hospital. Care during severe illness may include:   ? IV (intravenous) fluids.  These are given through a vein to help keep your body hydrated.   ? Oxygen. You may be given supplemental oxygen or ventilation with a breathing machine (ventilator). This is done so you get enough oxygen in your body.   ? Prone positioning.  Depending on how sick you are during your hospital stay, your healthcare team may turn you regularly on your stomach. This is called prone positioning. It helps increase the amount of oxygen you get to your lungs. Follow your healthcare team's instructions on position changes while you're in the hospital. Also follow their discharge advice on the best positions to help your breathing once you go home.   People who have had COVID-19 and are fully recovered may be asked by their healthcare team to consider donating plasma. This is called COVID-19 convalescent plasma donation. Plasma from people fully recovered from COVID-19 may contain antibodies to help fight COVID-19 in people who are currently seriously ill with the disease. Experts don't know if the donated plasma will work well as a treatment. Research continues, and the FDA has approved it for emergency use in certain people with serious or life-threatening COVID-19. Talk with your provider to learn more about convalescent plasma donation and whether you qualify to donate.       Are you at risk for COVID-19?  You are at risk for COVID-19 if you have had close contact with someone with the virus, or if you live in or traveled to an area with cases of it. Close contact means being within about 6 feet of someone, or living in the same house or visiting a person who has or may have COVID-19. Some recent studies suggest that COVID-19 may be spread by people who are not showing symptoms.     Preventing the Spread of Infection    ?     Hand Hygiene    The best way to prevent the spread of infections is to wash your hands or use hand sanitizer. Staff will clean hands between tasks and upon entering and exiting your hospital room. ?   For patients and visitors:    Clean hands frequently, upon entering and exiting a room, and after coughing and sneezing.    When washing hands with soap and water:   ? Wet hands with warm water  ? Apply soap   ? Lather soap by rubbing hands together for 20 seconds, covering all surfaces of hands and fingers  ? Rinse hands thoroughly  ? Dry hands with paper towel  ? Use a towel to turn off faucet  ?  When cleaning hands with hand sanitizer:   ? Apply hand sanitizer to hands  ? Rub on hands covering of hands and fingers until dry (about 15 to 20 seconds) ?      Coronavirus Disease 2019 (COVID-19): Caring for Yourself or Others   If you or a household member have symptoms of COVID-19, follow the guidelines below for preventing spread of the virus, and managing symptoms.     If you think you have COVID-19 symptoms  ? Stay home. Call your healthcare provider and tell them you have symptoms of COVID-19. Do this before going to any hospital or  clinic. Follow your provider's instructions. You may be advised to isolate yourself at home. This is called self-isolation.   ? Don?t panic. Keep in mind that other illnesses can cause similar symptoms.   ? Stay away from work, school, and public places. Limit physical contact with family members. Limit visitors. Don't kiss anyone or share eating or drinking utensils. Clean surfaces you touch with disinfectant. This is to help prevent the virus from spreading.   ? If you need to cough or sneeze, do it into a tissue. Then throw the tissue into the trash. If you don't have tissues, cough or sneeze into the bend of your elbow.   ? Don?t share food or personal items with people in your household. This includes items like eating and drinking utensils, towels, and bedding.   ? Wear a cloth face mask around other people. During a public health emergency, medical face masks may be reserved for healthcare workers. You may need to make a cloth face mask of your own. You can do this using a bandana, T-shirt, or other cloth. The CDC has instructions on how to make a face mask. Wear the mask so that it covers both your nose and mouth.   ? If you need to go to a hospital or clinic, expect that the healthcare staff will wear protective equipment such as masks, gowns, gloves, and eye protection. You may be put in a separate room. This is to prevent the possible virus from spreading.   ? Tell the healthcare staff about recent travel. This includes local travel on public transport. Staff may need to find other people you have been in contact with.   ? Follow all instructions the healthcare staff give you.    If you have been diagnosed with COVID-19  ? Stay home and start self-isolation. Don?t leave your home unless you need to get medical care. Don't go to work, school, or public areas. Don't use public transportation or taxis.   ? Follow all instructions from your healthcare provider. Call your healthcare provider?s office before going. They can prepare and give you instructions. This will help prevent the virus from spreading.   ? If you need to go to a hospital or clinic, expect that the healthcare staff will wear protective equipment such as masks, gowns, gloves, and eye protection. You may be put in a separate room. This is to prevent the possible virus from spreading.   ? Wear a face mask. This is to protect other people from your germs. If you are not able to wear a mask, your caregivers should. During a public health emergency, medical face masks may be reserved for healthcare workers. You may need to make a cloth face mask of your own. You can do this using a bandana, T-shirt, or other cloth. The CDC has instructions on how to make a face mask. Wear the mask so that it covers both your nose and mouth.   ? Stay away from other people in your home.  ? Have no contact with pets and animals.  ? Don?t share food or personal items with people in your household. This includes items like eating and drinking utensils, towels, and bedding.   ? If you need to cough or sneeze, do it into a tissue. Then throw the tissue into the trash. If you don't have tissues, cough or sneeze into the bend of your elbow.   ? Wash your hands often.    Self-care at home?  There  is currently no medicine approved to prevent or treat the virus. Some experimental and other medicines are being tested against COVID-19. Other medicines used to treat other conditions are being looked at for COVID-19, but they are not currently approved to treat it.   Current treatment is mainly aimed at helping your body while it fights the virus. This is known as supportive care. Take care of yourself at home by:   ? Getting rest. This helps your body fight the illness.   ? Staying hydrated.  Drinking liquids is the best way to prevent dehydration. Try to drink 6 to 8 glasses of liquids every day, or as advised by your provider. Also check with your provider about which fluids are best for you. Don't drink fluids that contain caffeine or alcohol.   ? Taking over-the-counter (OTC) pain medicine. These are used to help ease pain and reduce fever. Follow your healthcare provider's instructions for which OTC medicine to use.   If you've been in the hospital for suspected or confirmed COVID-19 and now are home, follow all of your healthcare team's instructions. This will include when it's OK to stop self-isolation. You may also get instructions on position changes to help your breathing, such as lying on your belly (prone positioning).   If you've had confirmed COVID-19, your healthcare team may ask you to consider donating your plasma. This is called COVID-19 convalescent plasma donation. Plasma from people fully recovered from COVID-19 may contain antibodies to help fight COVID-19 in people who are currently seriously ill with the disease. Experts don't know the safety of COVID-19 convalescent plasma or how well it works. Research continues. The FDA has approved it for emergency use in certain people with serious or life-threatening COVID-19.     Caring for a sick person?  ? Follow all instructions from healthcare staff.  ? Wash your hands often.  ? Wear protective clothing as advised.  ? Make sure the sick person wears a mask. If they can't wear a mask, don't stay in the same room with the person. If you must be in the same room, wear a face mask. When wearing a mask, make sure that it covers both the nose and mouth.   ? Keep track of the sick person?s symptoms.  ? Clean home surfaces often with disinfectant. This includes phones, kitchen counters, fridge door handle, bathroom surfaces, and others.   ? Don?t let anyone share household items with the sick person. This includes eating and drinking tools, towels, sheets, or blankets.   ? Clean fabrics and laundry thoroughly.  ? Keep other people and pets away from the sick person.    When you can stop self-isolation  When you are sick with COVID-19, you should stay away from other people. This is called self-isolation.   Your limits are different if you've had COVID-19 in the last 3 months but are fully recovered without symptoms and you have been exposed to someone with COVID-19. If you are symptom-free, you don't need to stay home away from others or be retested. The CDC doesn't recommend retesting unless you have symptoms of COVID-19 and your new symptoms can't be linked to another illness. Contact your healthcare provider if you have any questions. If you develop symptoms, stay home. If you had COVID-19 over 3 months ago and have been exposed again, treat it like you've never had COVID-19 and stay home, limit your contact with others, call your provider, and monitor for symptoms.   If you  are normally healthy, the CDC does not advise retesting for COVID-19 with nose-throat swabs. You can stop self-isolation when all 3 of these are true:   1. You have had no fever for at least 24 hours. This means no fever without medicine that reduces fever, such as acetaminophen, for at least 24 hours.   2. Your symptoms such as cough or trouble breathing have improved.   3. It has been at least 10 days since your first symptoms started.   Talk with your healthcare provider before you leave home. Tell them if the 3 things above are true for you. They may tell you it?s OK to leave home. In some cases, your state or local area may have specific advice. Your healthcare provider will tell you more.?   If you have a weak immune system and COVID-19, or if you've had severe COVID-19,  your instructions on when to stop isolation will be somewhat different. Some conditions and treatments can cause a weak immune system. These include cancer treatment, bone marrow or organ transplants, and conditions such as HIV or other immune system disorders. You may be advised to stay home from 10 days to 20 days after your symptoms first started. Your healthcare provider may want to retest you for COVID-19. Follow your provider's instructions.   When you return to public settings  When you are well enough to go outside your home, consider the CDC's guidance on cloth face masks:   ? The CDC advises all people over age 42 to wear cloth face masks in public settings when around people outside of their household, especially when it's hard to socially distance. For example, wear a face mask in populated places such as public transit, public protests and marches, and crowded stores, bars, and restaurants.   ? Cloth masks may help prevent people who have COVID-19 form spreading the virus to others.   ? Cloth masks are most likely to reduce COVID-19 spread when masks are widely used by people who are out in the public.   Certain people should not wear a face covering. This includes:   ? Children younger than 69 years old  ? Anyone with a health, developmental, or mental health condition that can be made worse by wearing a mask   ? Anyone who is unconscious or unable to remove the face covering without help. See the CDC's guidance on who should not wear a face mask.     When to call your healthcare provider  Call your healthcare provider right away if a sick person has any of these:   ? Trouble breathing  ? Pain or pressure in chest  If a sick person has any of these, call 911:  ? Trouble breathing that gets worse  ? Pain or pressure in chest that gets worse  ? Blue tint to lips or face  ? Fast or irregular heartbeat  ? Confusion or trouble waking  ? Fainting or loss of consciousness  ? Coughing up blood    Going home from the hospital   If you were diagnosed with COVID-19 and were recently discharged from the hospital:   ? Follow the instructions above for self-care and isolation.  ? Follow the hospital healthcare team?s specific instructions.   ? Ask questions if anything is unclear to you. Write down answers so you remember them.

## 2019-04-23 ENCOUNTER — Encounter: Admit: 2019-04-23 | Discharge: 2019-04-23 | Payer: MEDICARE

## 2019-12-20 ENCOUNTER — Encounter: Admit: 2019-12-20 | Discharge: 2019-12-20 | Payer: MEDICARE

## 2019-12-20 NOTE — Telephone Encounter
Heme/BMT Navigation Intake Assessment Document    Patient Name:  Melanie Tucker  DOB:  November 09, 1950    Date of Referral:  12/20/19    Diagnosis & Reason for Visit:  Establishing Care/Treatment Options - Follicular Lymphoma, Grade 3A      [place]-MR:  [id number] APPOINTMENT:   Future Appointments   Date Time Provider Department Center   12/26/2019  2:30 PM Michelene Heady, DO [facility] [place] Exam   01/11/2020  9:00 AM Almyra Deforest, MBBS [facility] Neurology       REFERRING PHYSICIAN:  Vertell Limber, PA           FACILITY:  [facility]       PHONE:  [phone]                          INSURANCE:   [employer]   Allergies: Codeine   Family Hx: Mother - Stomach Cancer; Onset Age: 29  Brother - Stomach Cancer; Onset Age: 41   Medical Hx: HTN, GAD, GERD, Depression, Hyperlipidemia, Osteopenia after menopause, OSA, TMJ   Surgical Hx: Arthroscopic knee surgery  Cholecystectomy  Tonsillectomy  Hysterectomy with Oophorectomy   Social Hx:  Married, retired  Current every day cigarette smoker. Years smoked: 45   HPI:  Melanie Tucker is a 69 year old female referred to Hematology by her surgery team for evaluation and treatment of her new diagnosis of Follicular lymphoma found on right arm mass excision on 12/05/2019. Patient reports that she saw her PCP on 11/22/2019 after noticing a nonpainful mass on her right arm a few weeks prior. She was sent for an MRI that showed a lesion and then was subsequently sent to surgery for an excisional biopsy. Pathology showed Follicular lymphoma, Grade 3A and also notes, the center of the lesion shows abundant coagulative necrosis, so a transformation to diffuse large B-cell lymphoma with necrosis cannot be excluded. Patient denies fever, chills, weight loss at this time.   Timeline of Events:   DATES       11/23/19 MRI Right Arm [facility] 1.9x1.8x1.8cm lesion medial to distal humerus within adipose tissue. Central nonenhancement rim enhancing with surrounding inflammation stranding.   12/05/19 Excisional BX [facility]  Right arm, excision:  - Follicular lymphoma, Grade 3A, follicular pattern    Comment: Immunostains show the neoplastic cells to be positive for CD20, CD10, BCL-6 and BCL-2. Ki-67 shows a proliferative index of up to 60% in follicular areas. The cells are negative for c-myc, EBV by ISH, and CD30. CD3 stains background T-cells. Of note, the center of the lesion shows abundant coagulative necrosis, so a transformation to diffuse large B-cell lymphoma with necrosis cannot be excluded.     Location of Films:  Requested from [facility]. Will be available in PACS once received.    Records from [facility] have been requested and will be in the Outside Records tab once obtained.    Allergies reviewed and verified with the patient, and documented in Epic:  Yes    NEEDS Assessment:    Genetic Counseling:  Genetic Assessment: Patients with two close family members diagnosed with cancer (up to 1st cousin and great aunt or uncle) (Mother and Brother w/ stomach cancer)    Genetic Intervention: Provided information about available services     Nutrition:  Recent Weight Loss Without Trying?: No  Eating Poorly Due to Decreased Appetite?: No  Score: Malnutrition Screening Tool (MST): 0  Additional Nutrition Assessment: No concerns identified  Nutrition Intervention: Provided information about available services    Social & Financial:  Social and Financial Assessment: Reports adequate support system;No needs identified  Tobacco assessment last 30 days: Patient has used tobacco products within the last 30 days  Social and Financial Intervention: Provided information about available services;Services declined at this time    Spiritual & Emotional:  Spiritual and Emotional Assessment: Reports adequate support system;No needs identified  Spiritual and Emotional Intervention: Provided information about available services    Physical:  Fall Risk: None identified  Physical needs: No needs identified  Physical Needs Intervention: Patient encouraged to use valet services for appointment(s)    Communication:  Communication Barrier: No       Onc Fertility:   Onc Fertility Assessment: Female patient is postmenopausal or has had hysterectomy       Additional Education:  Additional Education Documented: Yes      Patient Education   COVID-19 guidelines reviewed with patient, including: visitor and universal masking policies, and a temperature check at the facility entrance upon arrival.

## 2019-12-26 ENCOUNTER — Encounter: Admit: 2019-12-26 | Discharge: 2019-12-26 | Payer: MEDICARE

## 2019-12-26 DIAGNOSIS — C8229 Follicular lymphoma grade III, unspecified, extranodal and solid organ sites: Secondary | ICD-10-CM

## 2019-12-26 LAB — CBC AND DIFF
Lab: 0 K/UL (ref 0–0.20)
Lab: 0.1 K/UL (ref 0–0.45)
Lab: 0.4 K/UL (ref 0–0.80)
Lab: 1 % (ref >60)
Lab: 13 g/dL (ref 12.0–15.0)
Lab: 14 % (ref 11–15)
Lab: 2 K/UL (ref 1.0–4.8)
Lab: 244 K/UL (ref 150–400)
Lab: 3 % (ref 0–5)
Lab: 3 K/UL (ref >60)
Lab: 32 pg (ref 26–34)
Lab: 33 g/dL (ref 32.0–36.0)
Lab: 36 % (ref 24–44)
Lab: 4.2 M/UL (ref 4.0–5.0)
Lab: 41 % (ref 36–45)
Lab: 5.7 K/UL (ref 4.5–11.0)
Lab: 53 % (ref 41–77)
Lab: 7 % (ref 4–12)
Lab: 8.8 FL (ref 7–11)
Lab: 99 FL (ref 80–100)

## 2019-12-26 LAB — LDH-LACTATE DEHYDROGENASE: Lab: 183 U/L (ref 100–210)

## 2019-12-26 LAB — COMPREHENSIVE METABOLIC PANEL: Lab: 140 MMOL/L (ref 137–147)

## 2019-12-26 LAB — URIC ACID: Lab: 3.4 mg/dL (ref 2.0–7.0)

## 2019-12-26 NOTE — Progress Notes
Name: Melanie Tucker          MRN: [id number]      DOB: 1950/10/13      AGE: 69 y.o.   DATE OF SERVICE: 12/26/2019    Subjective:             Reason for Visit:  Heme/Onc Care      Melanie Tucker is a 69 y.o. female.   Oncology history:    Diagnosis & Reason for Visit:  Follicular Lymphoma, Grade 3A  She denies any symptoms. She found a lump on her right arm.  ?        [place]-MR:  [id number] APPOINTMENT:          Future Appointments   Date Time Provider Department Center   12/26/2019  2:30 PM Michelene Heady, DO [facility] [place] Exam   01/11/2020  9:00 AM Almyra Deforest, MBBS [facility] Neurology   ?  ?  REFERRING PHYSICIAN:  Vertell Limber, PA           FACILITY:  [facility]       PHONE:  [phone]                          INSURANCE:   [facility] [employer]   Allergies: Codeine   Family Hx: Mother - Stomach Cancer; Onset Age: 27  Brother - Stomach Cancer; Onset Age: 65   Medical Hx: HTN, GAD, GERD, Depression, Hyperlipidemia, Osteopenia after menopause, OSA, TMJ   Surgical Hx: Arthroscopic knee surgery  Cholecystectomy  Tonsillectomy  Hysterectomy with Oophorectomy   Social Hx:  Married, retired  Current every day cigarette smoker. Years smoked: 45   HPI:  Melanie Tucker is a 69 year old female referred to Hematology by her surgery team for evaluation and treatment of her new diagnosis of Follicular lymphoma found on right arm mass excision on 12/05/2019. Patient reports that she saw her PCP on 11/22/2019 after noticing a nonpainful mass on her right arm a few weeks prior. She was sent for an MRI that showed a lesion and then was subsequently sent to surgery for an excisional biopsy. Pathology showed Follicular lymphoma, Grade 3A and also notes, the center of the lesion shows abundant coagulative necrosis, so a transformation to diffuse large B-cell lymphoma with necrosis cannot be excluded. Patient denies fever, chills, weight loss at this time.   Timeline of Events:   DATES  ? ? ?   11/23/19 MRI Right Arm [facility] 1.9x1.8x1.8cm lesion medial to distal humerus within adipose tissue. Central nonenhancement rim enhancing with surrounding inflammation stranding.   12/05/19 Excisional BX [facility]  Right arm, excision:  - Follicular lymphoma, Grade 3A, follicular pattern    Comment: Immunostains show the neoplastic cells to be positive for CD20, CD10, BCL-6 and BCL-2. Ki-67 shows a proliferative index of up to 60% in follicular areas. The cells are negative for c-myc, EBV by ISH, and CD30. CD3 stains background T-cells. Of note, the center of the lesion shows abundant coagulative necrosis, so a transformation to diffuse large B-cell lymphoma with necrosis cannot be excluded.   ?    History of Present Illness  Past Medical History:    Medical History:   Diagnosis Date   ? Anxiety disorder    ? Back pain    ? Diarrhea    ? FH: migraines    ? High cholesterol    ? Hypertension    ? Osteoporosis    ?  Reflux    ? Sleep apnea    ? Stomach disorder    ? TMJ (dislocation of temporomandibular joint)        Past Surgical History:   Surgical History:   Procedure Laterality Date   ? MINIMALLY INVASIVE LUMBAR 5-SACRAL 1 MICRODISCECTOMY N/A 07/04/2018    Performed by Pablo [place], MD at [facility] OR   ? POSTERIOR LUMBAR DECOMPRESSION AND INTERBODY FUSION, LUMBAR 4-5 N/A 07/04/2018    Performed by Pablo [place], MD at [facility] OR   ? ALLOGRAFT/ MORSELIZED/ PLACEMENT OSTEOPROMOTIVE MATERIAL - SPINE SURGERY ONLY  07/04/2018    Performed by Pablo [place], MD at [facility] OR   ? LAMINOTOMY WITH DECOMPRESSION OF NERVE ROOT/ EXCISION HERNIATED DISC - 1 INTERSPACE - LUMBAR N/A 07/04/2018    Performed by Pablo [place], MD at [facility] OR   ? POSTERIOR NON-SEGMENTAL INSTRUMENTATION SPINE  07/04/2018    Performed by Pablo [place], MD at [facility] OR   ? ROBOT ASSISTED STEREOTACTIC COMPUTER ASSISTED PROCEDURE-SPINAL  07/04/2018    Performed by Pablo , MD at [facility] OR   ? CATARACT REMOVAL     ? HX ADENOIDECTOMY     ? HX CHOLECYSTECTOMY     ? HX HYSTERECTOMY     ? HX MENISCECTOMY Right    ? HX TONSILLECTOMY     ? PR CHOLECYSTECTOMY     ? PR REPAIR RECTOCELE SEPARATE PROCEDURE     ? SINUS SURGERY         Transfusion History: No    Medication list:   Current Outpatient Medications:   ?  alendronate (FOSAMAX) 70 mg tablet, , Disp: , Rfl:   ?  aspirin EC 81 mg tablet, Take one tablet by mouth at bedtime daily. Take with food. HOLD for 1 week post surgery, may RESUME on 07/11/2018, Disp: 90 tablet, Rfl:   ?  Calcium Carbonate 600 mg calcium (1,500 mg) tab, Daily, Disp: , Rfl:   ?  calcium carbonate/vitamin D-3 (OSCAL-500+D) 1250 mg/200 unit tablet, Take 1 tablet by mouth daily. Calcium Carb 1250mg  delivers 500mg  elemental Ca, Disp: , Rfl:   ?  citalopram (CELEXA) 20 mg tablet, Take 20 mg by mouth daily.  , Disp: , Rfl:   ?  clonazePAM (KLONOPIN) 1 mg tablet, Take 1 mg by mouth at bedtime daily., Disp: , Rfl:   ?  cyclobenzaprine (FLEXERIL) 10 mg tablet, , Disp: , Rfl:   ?  fish oil- omega 3-DHA/EPA 300/1,000 mg capsule, Take 1 capsule by mouth at bedtime daily., Disp: , Rfl:   ?  L.acid/L.casei/B.bif/B.lon/FOS (PROBIOTIC BLEND PO), Take  by mouth., Disp: , Rfl:   ?  losartan (COZAAR) 50 mg tablet, Take 50 mg by mouth daily., Disp: , Rfl:   ?  pantoprazole DR (PROTONIX) 40 mg tablet, Take 40 mg by mouth daily., Disp: , Rfl:   ?  pregabalin (LYRICA) 75 mg capsule, , Disp: , Rfl:   ?  simvastatin (ZOCOR) 20 mg tablet, Take 20 mg by mouth at bedtime daily.  , Disp: , Rfl:   ?  vitamins, multiple tablet, Take 1 tablet by mouth daily., Disp: , Rfl:     Allergy List:   Allergies   Allergen Reactions   ? Codeine SYNCOPE     Allergy recorded in SMS: Codeine~Reactions: SYNCOPE  Pt reported reaction after giving birth       Social History:   Social History     Socioeconomic History   ?  Marital status: Married     Spouse name: Not on file   ? Number of children: Not on file   ? Years of education: Not on file   ? Highest education level: Not on file   Occupational History   ? Not on file   Tobacco Use   ? Smoking status: Current Every Day Smoker     Packs/day: 0.25     Years: 50.00     Pack years: 12.50     Types: Cigarettes   ? Smokeless tobacco: Never Used   Vaping Use   ? Vaping Use: Never used   Substance and Sexual Activity   ? Alcohol use: No   ? Drug use: Never   ? Sexual activity: Not Currently   Other Topics Concern   ? Not on file   Social History Narrative   ? Not on file       Family History:   Family History   Problem Relation Age of Onset   ? Cancer Mother    ? Depression Mother    ? Heart Disease Father    ? Cancer Brother    ? Migraines Brother    brother and mother passed away from stomach cancer         Review of Systems   Constitutional: Negative for chills, diaphoresis, fatigue and fever.   HENT: Negative for congestion.    Eyes: Negative for visual disturbance.   Respiratory: Negative for cough and shortness of breath.    Cardiovascular: Negative for chest pain.   Gastrointestinal: Negative for abdominal pain, constipation, diarrhea, nausea and vomiting.   Genitourinary: Negative for difficulty urinating.   Musculoskeletal: Positive for back pain. Negative for arthralgias and myalgias.   Skin: Negative for rash.   Neurological: Negative for headaches.   Hematological: Negative for adenopathy. Does not bruise/bleed easily.   Psychiatric/Behavioral: Negative.          Objective:         ? alendronate (FOSAMAX) 70 mg tablet    ? aspirin EC 81 mg tablet Take one tablet by mouth at bedtime daily. Take with food. HOLD for 1 week post surgery, may RESUME on 07/11/2018   ? Calcium Carbonate 600 mg calcium (1,500 mg) tab Daily   ? calcium carbonate/vitamin D-3 (OSCAL-500+D) 1250 mg/200 unit tablet Take 1 tablet by mouth daily. Calcium Carb 1250mg  delivers 500mg  elemental Ca   ? citalopram (CELEXA) 20 mg tablet Take 20 mg by mouth daily.     ? clonazePAM (KLONOPIN) 1 mg tablet Take 1 mg by mouth at bedtime daily.   ? cyclobenzaprine (FLEXERIL) 10 mg tablet    ? fish oil- omega 3-DHA/EPA 300/1,000 mg capsule Take 1 capsule by mouth at bedtime daily.   ? L.acid/L.casei/B.bif/B.lon/FOS (PROBIOTIC BLEND PO) Take  by mouth.   ? losartan (COZAAR) 50 mg tablet Take 50 mg by mouth daily.   ? pantoprazole DR (PROTONIX) 40 mg tablet Take 40 mg by mouth daily.   ? pregabalin (LYRICA) 75 mg capsule    ? simvastatin (ZOCOR) 20 mg tablet Take 20 mg by mouth at bedtime daily.     ? vitamins, multiple tablet Take 1 tablet by mouth daily.     Vitals:    12/26/19 1402 12/26/19 1406   BP: 135/50    BP Source: Arm, Left Upper    Patient Position: Sitting    Pulse: 76    Resp: 18    Temp: 36.3 ?C (97.3 ?F)  TempSrc: Temporal    SpO2: 98%    Weight: 75.9 kg (167 lb 6.4 oz)    Height: 157.2 cm (61.89)    PainSc: Zero Zero     Body mass index is 30.73 kg/m?Marland Kitchen     Pain Score: Zero       Fatigue Scale: 0-None    Pain Addressed:  N/A    Patient Evaluated for a Clinical Trial: No treatment clinical trial available for this patient.     [place] Cooperative Oncology Group performance status is 0, Fully active, able to carry on all pre-disease performance without restriction.Marland Kitchen     Physical Exam  Constitutional:       Appearance: She is well-developed.   HENT:      Head: Normocephalic and atraumatic.      Mouth/Throat:      Pharynx: No oropharyngeal exudate.   Eyes:      Pupils: Pupils are equal, round, and reactive to light.   Cardiovascular:      Rate and Rhythm: Normal rate and regular rhythm.      Heart sounds: No murmur heard.     Pulmonary:      Effort: Pulmonary effort is normal.      Breath sounds: Normal breath sounds. No wheezing.   Abdominal:      General: Bowel sounds are normal.      Palpations: There is no mass.   Musculoskeletal:      Cervical back: Neck supple.      Right lower leg: No edema.      Left lower leg: No edema.   Lymphadenopathy:      Cervical: No cervical adenopathy.      Upper Body:      Right upper body: No supraclavicular or axillary adenopathy.      Left upper body: No supraclavicular or axillary adenopathy.   Skin:     Findings: No rash.   Neurological:      Mental Status: She is alert and oriented to person, place, and time.      Cranial Nerves: No cranial nerve deficit.     right surgical site free from infection- No mass palpated of the right arm          Assessment and Plan:  1.  Follicular lymphoma of the right arm grade 3A follicular pattern with a Ki 67 score 60%-depending her stage dictate how much chemotherapy she will need.  I will check a CBC, LDH, CMP, beta-2 microglobulin, and uric acid today.  I will check a PET scan and bone marrow biopsy to fully stage.    2.  Return to clinic-I will have the patient return to clinic in 2 weeks.    I answered the patient's questions to her satisfaction.  I spent 45 minutes of this visit in which at least 50% of it was face to face consultation with the patient.

## 2020-01-03 ENCOUNTER — Encounter: Admit: 2020-01-03 | Discharge: 2020-01-03 | Payer: MEDICARE

## 2020-01-03 DIAGNOSIS — C8229 Follicular lymphoma grade III, unspecified, extranodal and solid organ sites: Secondary | ICD-10-CM

## 2020-01-07 ENCOUNTER — Encounter: Admit: 2020-01-07 | Discharge: 2020-01-07 | Payer: MEDICARE

## 2020-01-10 ENCOUNTER — Encounter: Admit: 2020-01-10 | Discharge: 2020-01-10 | Payer: MEDICARE

## 2020-01-10 NOTE — Telephone Encounter
-----  Message from Leanna Battles, DO sent at 01/10/2020  9:55 AM CST -----  Please call Melanie Tucker about her bone marrow biopsy per the report it was negative for lymphoma.  The cytogenetics are pending and probably will be back in several weeks but the read by the pathologist was negative.    Dr. Mickie Hillier

## 2020-01-11 ENCOUNTER — Encounter: Admit: 2020-01-11 | Discharge: 2020-01-11 | Payer: MEDICARE

## 2020-01-16 ENCOUNTER — Encounter: Admit: 2020-01-16 | Discharge: 2020-01-16 | Payer: MEDICARE

## 2020-01-16 DIAGNOSIS — F419 Anxiety disorder, unspecified: Secondary | ICD-10-CM

## 2020-01-16 DIAGNOSIS — E7800 Pure hypercholesterolemia, unspecified: Secondary | ICD-10-CM

## 2020-01-16 DIAGNOSIS — S0300XA Dislocation of jaw, unspecified side, initial encounter: Secondary | ICD-10-CM

## 2020-01-16 DIAGNOSIS — C8229 Follicular lymphoma grade III, unspecified, extranodal and solid organ sites: Secondary | ICD-10-CM

## 2020-01-16 DIAGNOSIS — IMO0001 Reflux: Secondary | ICD-10-CM

## 2020-01-16 DIAGNOSIS — R197 Diarrhea, unspecified: Secondary | ICD-10-CM

## 2020-01-16 DIAGNOSIS — I10 Essential (primary) hypertension: Secondary | ICD-10-CM

## 2020-01-16 DIAGNOSIS — Z820 Family history of epilepsy and other diseases of the nervous system: Secondary | ICD-10-CM

## 2020-01-16 DIAGNOSIS — M549 Dorsalgia, unspecified: Secondary | ICD-10-CM

## 2020-01-16 DIAGNOSIS — M810 Age-related osteoporosis without current pathological fracture: Secondary | ICD-10-CM

## 2020-01-16 DIAGNOSIS — G4730 Sleep apnea, unspecified: Secondary | ICD-10-CM

## 2020-01-16 DIAGNOSIS — K319 Disease of stomach and duodenum, unspecified: Secondary | ICD-10-CM

## 2020-01-16 NOTE — Progress Notes
Name: Melanie Tucker          MRN: [id number]      DOB: 1950-03-28      AGE: 69 y.o.   DATE OF SERVICE: 01/16/2020    Subjective:             Reason for Visit:  Heme/Onc Care      Melanie Tucker is a 69 y.o. female presents to my oncology clinic for follow-up for her follicular lymphoma.        History of Present Illness     Diagnosis & Reason for Visit:  Follicular Lymphoma, Grade 3A  She denies any symptoms. She found a lump on her right arm.  ?        [place]-MR:  [id number] APPOINTMENT:          Future Appointments   Date Time Provider Department Center   12/26/2019  2:30 PM Michelene Heady, DO [facility] [place] Exam   01/11/2020  9:00 AM Almyra Deforest, MBBS [facility] Neurology   ?  ?  REFERRING PHYSICIAN:  Vertell Limber, PA           FACILITY:  [facility]       PHONE:  [phone]                          INSURANCE:   [employer]   Allergies: Codeine   Family Hx: Mother - Stomach Cancer; Onset Age: 24  Brother - Stomach Cancer; Onset Age: 26   Medical Hx: HTN, GAD, GERD, Depression, Hyperlipidemia, Osteopenia after menopause, OSA, TMJ   Surgical Hx: Arthroscopic knee surgery  Cholecystectomy  Tonsillectomy  Hysterectomy with Oophorectomy   Social Hx:  Married, retired  Current every day cigarette smoker. Years smoked: 45   HPI:  Melanie Tucker is a 69 year old female referred to Hematology by her surgery team for evaluation and treatment of her new diagnosis of Follicular lymphoma found on right arm mass excision on 12/05/2019. Patient reports that she saw her PCP on 11/22/2019 after noticing a nonpainful mass on her right arm a few weeks prior. She was sent for an MRI that showed a lesion and then was subsequently sent to surgery for an excisional biopsy. Pathology showed Follicular lymphoma, Grade 3A and also notes, the center of the lesion shows abundant coagulative necrosis, so a transformation to diffuse large B-cell lymphoma with necrosis cannot be excluded. Patient denies fever, chills, weight loss at this time.   Timeline of Events:   DATES  ? ? ?   11/23/19 MRI Right Arm [facility] 1.9x1.8x1.8cm lesion medial to distal humerus within adipose tissue. Central nonenhancement rim enhancing with surrounding inflammation stranding.   12/05/19 Excisional BX [facility]  Right arm, excision:  - Follicular lymphoma, Grade 3A, follicular pattern    Comment: Immunostains show the neoplastic cells to be positive for CD20, CD10, BCL-6 and BCL-2. Ki-67 shows a proliferative index of up to 60% in follicular areas. The cells are negative for c-myc, EBV by ISH, and CD30. CD3 stains background T-cells. Of note, the center of the lesion shows abundant coagulative necrosis, so a transformation to diffuse large B-cell lymphoma with necrosis cannot be excluded.   ?  PET scan from January 02, 2020:  Hypermetabolic adenopathy in the neck, both axillas, left posterior chest wall or shoulder, upper abdomen and left inguinal regions.  The most intense activity is within the left inguinal lymph node.  Mild  activity in the subcutaneous fat just above the right elbow at the site of the mass seen on November 28, 2019 which could represent postsurgical changes but residual lymphoma the site is also possible.    Bone marrow biopsy from January 04, 2020:  Normocellular marrow 30% with normal trilineage hematopoiesis.  Negative for lymphoma  Peripheral blood: Normal peripheral smear  Iron stain on the aspirate smear shows increased storage iron and no ringed sideroblasts  Flow cytometry was a negative immunophenotypic study  Cytogenetics show a normal karyotype       Review of Systems   Constitutional: Positive for fatigue. Negative for chills, diaphoresis and fever.   HENT: Negative for congestion.    Eyes: Negative for visual disturbance.   Respiratory: Negative for cough and shortness of breath.    Cardiovascular: Negative for chest pain.   Gastrointestinal: Negative for abdominal pain, constipation, diarrhea, nausea and vomiting.   Genitourinary: Negative for difficulty urinating.   Musculoskeletal: Positive for back pain. Negative for arthralgias and myalgias.   Skin: Negative for rash.   Neurological: Positive for dizziness (chronic). Negative for headaches.   Hematological: Negative for adenopathy. Does not bruise/bleed easily.   Psychiatric/Behavioral: Negative.          Objective:         ? alendronate (FOSAMAX) 70 mg tablet    ? aspirin EC 81 mg tablet Take one tablet by mouth at bedtime daily. Take with food. HOLD for 1 week post surgery, may RESUME on 07/11/2018   ? Calcium Carbonate 600 mg calcium (1,500 mg) tab Daily   ? calcium carbonate/vitamin D-3 (OSCAL-500+D) 1250 mg/200 unit tablet Take 1 tablet by mouth daily. Calcium Carb 1250mg  delivers 500mg  elemental Ca   ? citalopram (CELEXA) 20 mg tablet Take 20 mg by mouth daily.     ? clonazePAM (KLONOPIN) 1 mg tablet Take 1 mg by mouth at bedtime daily.   ? cyclobenzaprine (FLEXERIL) 10 mg tablet    ? fish oil- omega 3-DHA/EPA 300/1,000 mg capsule Take 1 capsule by mouth at bedtime daily.   ? L.acid/L.casei/B.bif/B.lon/FOS (PROBIOTIC BLEND PO) Take  by mouth.   ? losartan (COZAAR) 50 mg tablet Take 50 mg by mouth daily.   ? pantoprazole DR (PROTONIX) 40 mg tablet Take 40 mg by mouth daily.   ? pregabalin (LYRICA) 75 mg capsule    ? simvastatin (ZOCOR) 20 mg tablet Take 20 mg by mouth at bedtime daily.     ? vitamins, multiple tablet Take 1 tablet by mouth daily.     Vitals:    01/16/20 1313   BP: 125/55   BP Source: Arm, Left Upper   Patient Position: Sitting   Pulse: 72   Resp: 18   Temp: 36.3 ?C (97.3 ?F)   TempSrc: Temporal   SpO2: 98%   Weight: 76.1 kg (167 lb 12.8 oz)   Height: 157.2 cm (61.89)   PainSc: Zero     Body mass index is 30.8 kg/m?Marland Kitchen     Pain Score: Zero       Fatigue Scale: 0-None    Pain Addressed:  N/A    Patient Evaluated for a Clinical Trial: No treatment clinical trial available for this patient.     [place] Cooperative Oncology Group performance status is 0, Fully active, able to carry on all pre-disease performance without restriction.Marland Kitchen     Physical Exam  Constitutional:       Appearance: She is well-developed.   HENT:  Head: Normocephalic and atraumatic.      Mouth/Throat:      Pharynx: No oropharyngeal exudate.   Eyes:      Pupils: Pupils are equal, round, and reactive to light.   Cardiovascular:      Rate and Rhythm: Normal rate and regular rhythm.      Heart sounds: No murmur heard.     Pulmonary:      Effort: Pulmonary effort is normal.      Breath sounds: Normal breath sounds. No wheezing.   Abdominal:      General: Bowel sounds are normal.      Palpations: There is no mass.   Musculoskeletal:      Cervical back: Neck supple.      Right lower leg: No edema.      Left lower leg: No edema.   Lymphadenopathy:      Cervical: No cervical adenopathy.      Upper Body:      Right upper body: No supraclavicular or axillary adenopathy.      Left upper body: No supraclavicular or axillary adenopathy.   Skin:     Findings: No rash.   Neurological:      Mental Status: She is alert and oriented to person, place, and time.      Cranial Nerves: No cranial nerve deficit.     right surgical site free from infection- No mass palpated of the right arm          Assessment and Plan:  1.  Follicular lymphoma of the right arm grade 3A follicular pattern with a Ki 67 score 60% with lymphadenopathy in the neck, both axillas, left posterior chest wall or shoulder, upper abdomen and left inguinal regions-her beta-2 microglobulin was normal at 1.9 on December 26, 2019.  Her PET scan from January 02, 2020 showed hypermetabolic activity in the neck, both axillas, left posterior chest wall, upper abdomen and left inguinal regions.  Her bone marrow biopsy from January 04, 2020 was negative for lymphoma.  She is a stage III.  Since she is asymptomatic, I will do aggressive surveillance.  I will check a repeat PET scan in 3 months.  I will check a CBC, CMP, LDH and sed rate in 3 months.  2.  Return to clinic-I will have the patient return to clinic in 3 months.    I answered the patient's questions to her satisfaction.  I spent 45 minutes of this visit in which at least 50% of it was face to face consultation with the patient.

## 2020-02-28 IMAGING — CR PELVIS
5 series · 5 of 5 positions shown · non-contrast
Comparison: none

[l-spine ap]
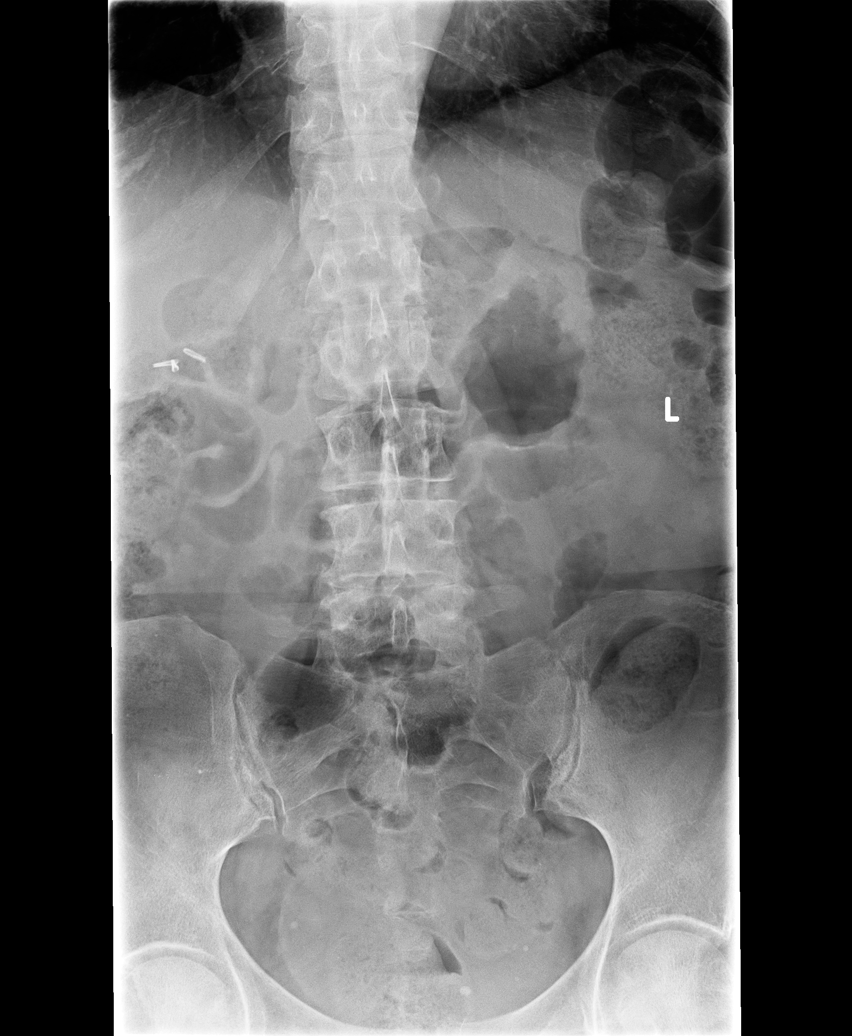

[l-spine lat]
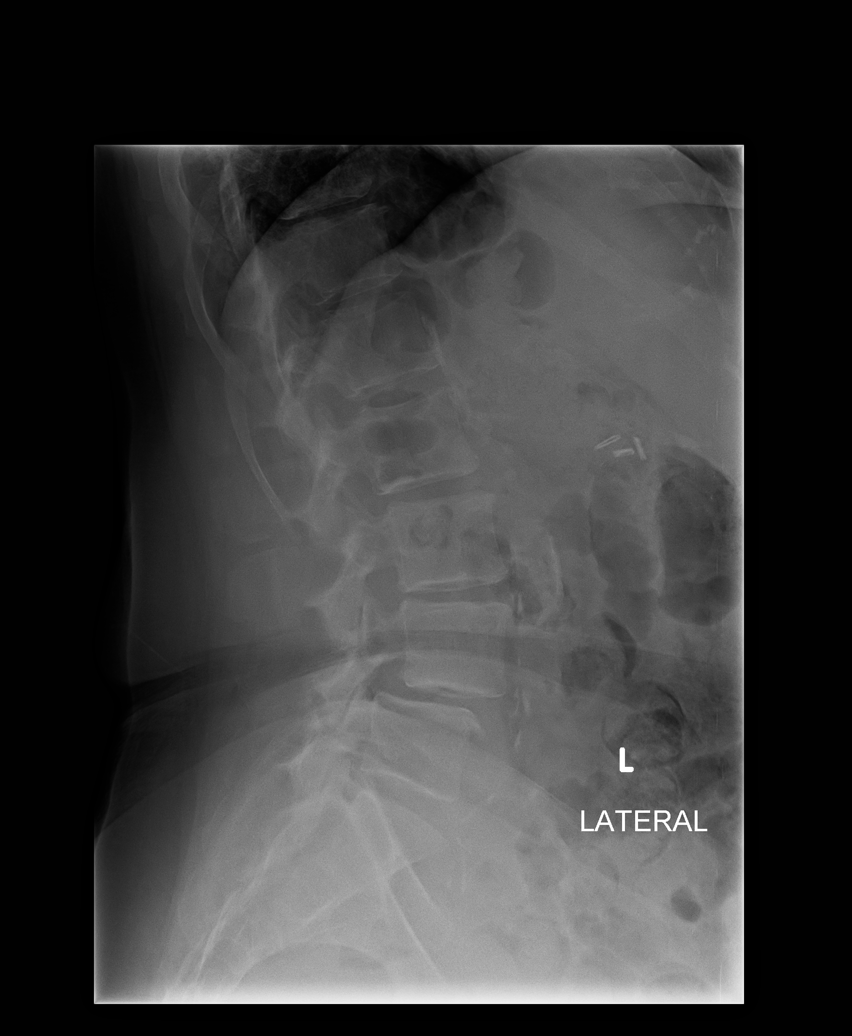

[l-spine l5-s1]
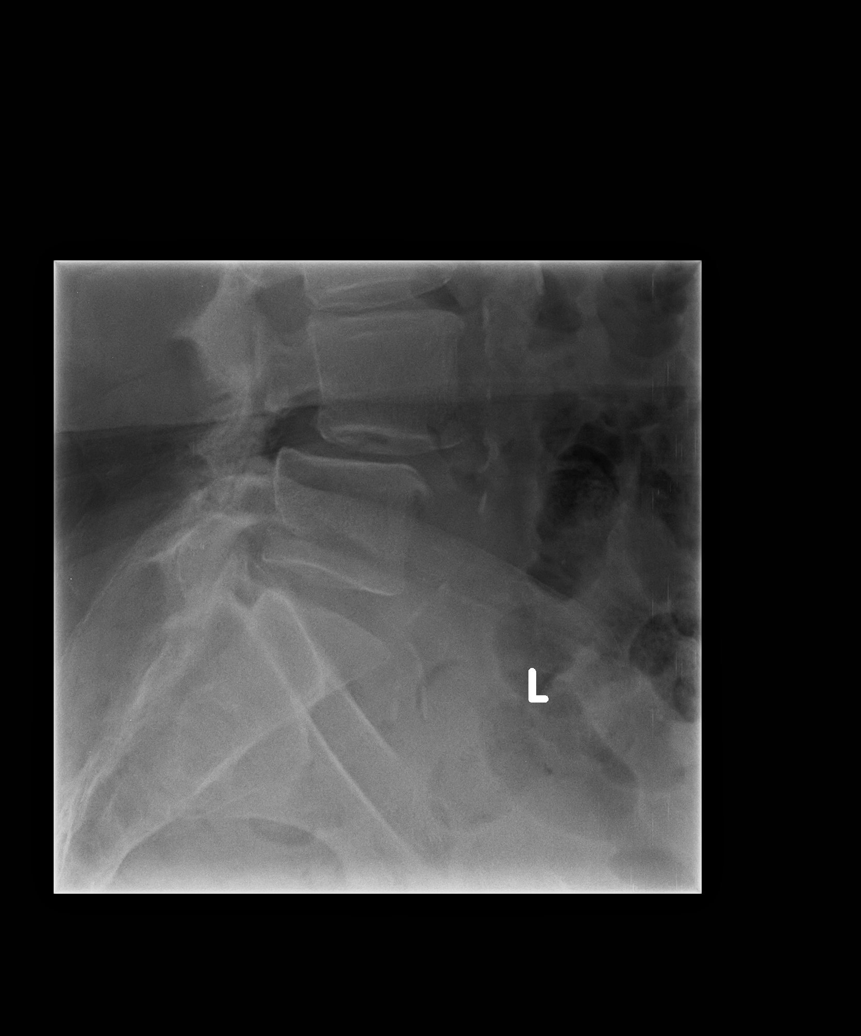

[l-spine lat flex]
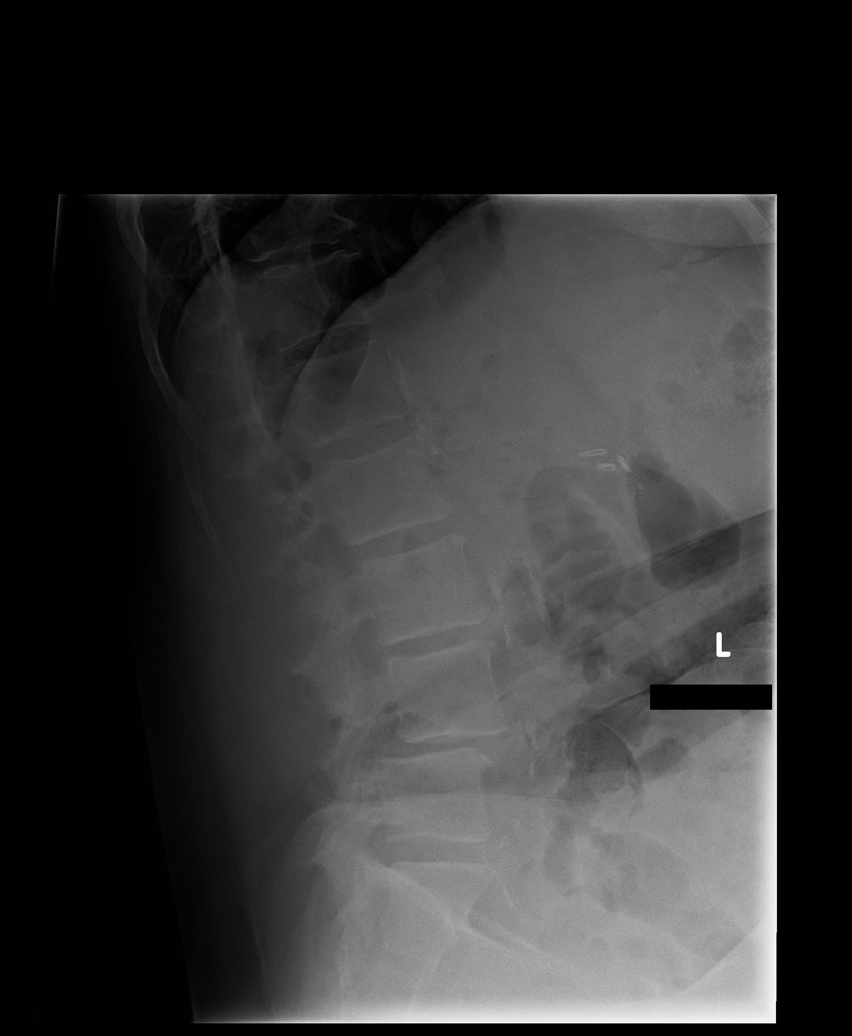

[l-spine lat ext]
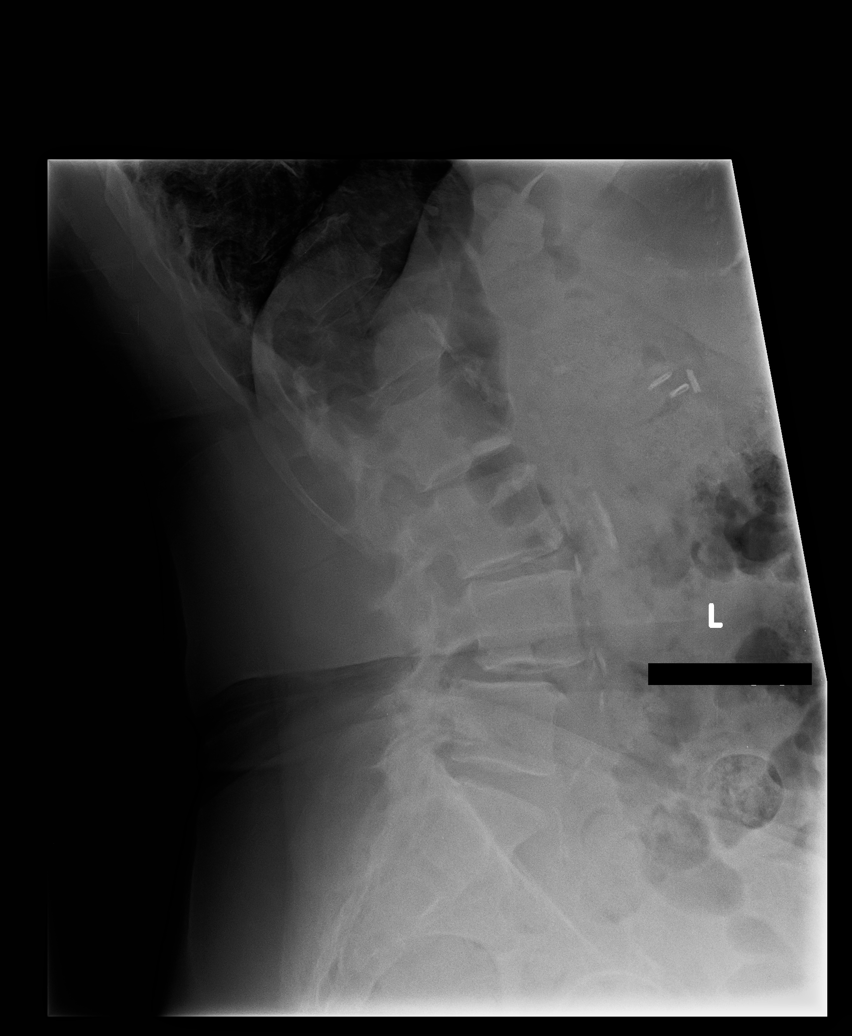

[5 of 5 positions shown; findings below may reference images not displayed]

DIAGNOSTIC STUDIES

EXAM

Lumbar spine radiographs.

INDICATION

PT C/O LOW BACK PAIN. HX OF DISC HERNIATION. TJ

TECHNIQUE

Five views of the lumbar spine.

COMPARISONS

June 12, 2018

FINDINGS

There is grade 2 anterolisthesis of L4 on L5. This was also present previously. Mild apex right
curvature of the lower thoracic spine. Cholecystectomy clips. Mild sacroiliac joint degeneration.
Mild diffuse lumbar spondylosis. No aggressive osseous lesion. Disc space narrowing is most
pronounced at L4-5.

IMPRESSION

Grade 2 anterolisthesis of L4 on L5 with spondylosis. Facet arthropathy also noted.

Tech Notes:

PT C/O LOW BACK PAIN. HX OF DISC HERNIATION. TJ

## 2020-03-19 ENCOUNTER — Encounter

## 2020-03-19 DIAGNOSIS — C8229 Follicular lymphoma grade III, unspecified, extranodal and solid organ sites: Secondary | ICD-10-CM

## 2020-03-19 NOTE — Telephone Encounter
-----   Message from Leanna Battles, DO sent at 03/19/2020 12:58 PM CST -----  Regarding: RE: Jury Duty  Yes she can  ----- Message -----  From: Lenda Kelp, RN  Sent: 03/19/2020  11:08 AM CST  To: Leanna Battles, DO  Subject: Jury Duty                                        Pt wants to know if you think she should do jury duty or not?  She was summoned for March & April this year

## 2021-11-24 ENCOUNTER — Encounter: Admit: 2021-11-24 | Discharge: 2021-11-24 | Payer: MEDICARE

## 2022-05-19 ENCOUNTER — Encounter: Admit: 2022-05-19 | Discharge: 2022-05-19 | Payer: MEDICARE

## 2022-05-20 ENCOUNTER — Encounter: Admit: 2022-05-20 | Discharge: 2022-05-20 | Payer: MEDICARE

## 2022-05-20 NOTE — Telephone Encounter
Pt contacted clinic stating she was referred to see an ophthalmologist.  She has confirmed Sjogrens.  LVM asking pt to return call to sched.  PSR: When pt calls back please offer her  4/29 8:30 or 4/30 @ 8:15 w/ [place] or 5/1 w/ goins at 9:20.  Please see if she is taking plaquenil.  If so pt will need a 10-2 visual field please.  No further action required at this time

## 2023-03-10 ENCOUNTER — Encounter: Admit: 2023-03-10 | Discharge: 2023-03-10 | Payer: MEDICARE
